# Patient Record
Sex: Female | Born: 1995 | Race: White | Hispanic: No | Marital: Single | Smoking: Never smoker
Health system: Southern US, Community
[De-identification: ages and names within clinical notes are randomized; demographics above are authoritative.]

## PROBLEM LIST (undated history)

## (undated) DIAGNOSIS — R569 Unspecified convulsions: Secondary | ICD-10-CM

## (undated) DIAGNOSIS — H547 Unspecified visual loss: Secondary | ICD-10-CM

## (undated) DIAGNOSIS — G809 Cerebral palsy, unspecified: Secondary | ICD-10-CM

## (undated) DIAGNOSIS — Z7689 Persons encountering health services in other specified circumstances: Secondary | ICD-10-CM

## (undated) HISTORY — DX: Unspecified visual loss: H54.7

## (undated) HISTORY — PX: OTHER SURGICAL HISTORY: SHX169

## (undated) HISTORY — DX: Persons encountering health services in other specified circumstances: Z76.89

## (undated) HISTORY — DX: Cerebral palsy, unspecified: G80.9

---
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
## 2015-01-01 NOTE — Telephone Encounter (Signed)
The Rx is in the filing cabinet up front.

## 2015-08-17 ENCOUNTER — Encounter: Payer: Self-pay | Admitting: Pediatrics

## 2015-08-17 ENCOUNTER — Ambulatory Visit (INDEPENDENT_AMBULATORY_CARE_PROVIDER_SITE_OTHER): Payer: Medicaid Other | Admitting: Pediatrics

## 2015-08-17 VITALS — BP 110/78 | Temp 99.5°F | Wt 104.4 lb

## 2015-08-17 DIAGNOSIS — G801 Spastic diplegic cerebral palsy: Secondary | ICD-10-CM | POA: Diagnosis not present

## 2015-08-17 DIAGNOSIS — Q829 Congenital malformation of skin, unspecified: Secondary | ICD-10-CM | POA: Diagnosis not present

## 2015-08-17 DIAGNOSIS — R6889 Other general symptoms and signs: Secondary | ICD-10-CM | POA: Diagnosis not present

## 2015-08-17 DIAGNOSIS — G808 Other cerebral palsy: Secondary | ICD-10-CM

## 2015-08-17 DIAGNOSIS — Z0001 Encounter for general adult medical examination with abnormal findings: Secondary | ICD-10-CM

## 2015-08-17 DIAGNOSIS — L858 Other specified epidermal thickening: Secondary | ICD-10-CM

## 2015-08-17 NOTE — Patient Instructions (Signed)
Julie Richmond is doing well today  should consider transitioning to adult provider by age 20

## 2015-08-17 NOTE — Progress Notes (Signed)
Routine Well-Adolescent Visit  [name]'s personal or confidential phone number: N/A  PCP: Carma Leaven, MD   History was provided by the mother.legal guardian  Julie Richmond is a 20 y.o. female who is here for well check. .   Current concerns:has spots on the back of her arms, as been there a long time,noone else has them, not bothersome.    ROS:     Constitutional  Afebrile, normal appetite, normal activity.   Opthalmologic  no irritation or drainage.   ENT  no rhinorrhea or congestion , no sore throat, no ear pain. Cardiovascular  No chest pain Respiratory  no cough , wheeze or chest pain.  Gastointestinal  no abdominal pain, nausea or vomiting, bowel movements normal.     Genitourinary  no urgency, frequency or dysuria.   Musculoskeletal  no complaints of pain, no injuries.   Dermatologic  no rashes or lesions Neurologic - no significant history of headaches, no weakness  family history includes COPD in her paternal grandmother; Cancer in her paternal grandmother; Diabetes in her maternal grandfather.   Adolescent Assessment:  Confidentiality was discussed with the patient and if applicable, with caregiver as well.  Home and Environment:  Lives with: lives at home with mother  Sports/Exercise: in wheelchair  Smoking: no Secondhand smoke exposure? yes -  Drugs/EtOH: no   Sexuality:  -Menarche: age - females:  last menses: has menstrual suppression  - Sexually active? no  - sexual partners in last year:  - contraception use:  - Last STI Screening: none  - Violence/Abuse:   Mood: Suicidality and Depression:  Weapons:      Physical Exam:  BP 110/78 mmHg  Temp(Src) 99.5 F (37.5 C) (Temporal)  Wt 104 lb 6.4 oz (47.356 kg)  Weight: 7%ile (Z=-1.45) based on CDC 2-20 Years weight-for-age data using vitals from 08/17/2015. Normalized weight-for-stature data available only for age 50 to 5 years.  Height: No height on file for this encounter.  No  height on file for this encounter.    Objective:         General alert in NAD  Derm   keratosis pilaris on posterior upper arms  Head Normocephalic, atraumatic                    Eyes Normal, no discharge  Ears:   TMs normal bilaterally  Nose:   patent normal mucosa, turbinates normal, no rhinorhea  Oral cavity  moist mucous membranes, no lesions  Throat:   normal tonsils, without exudate or erythema  Neck supple FROM  Lymph:   . no significant cervical adenopathy  Lungs:  clear with equal breath sounds bilaterally  Breast   Heart:   regular rate and rhythm, no murmur  Abdomen:  soft nontender no organomegaly or masses  GU:  normal female  back No deformity no scoliosis  Extremities:   marked lower extremity spasticity with moderate flexion contractures at hips and knees  Neuro: Diplegic CP          Assessment/Plan:  1. Encounter for general adult medical examination with abnormal findings   2. 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 Mother has obtained permanent guardianship papers   3. Keratosis pilaris Discussed genetic nature. Can try noxema for control .  BMI: is appropriate for age  Counseling completed for all of the following vaccine components No orders of the defined types were placed in this encounter.    No Follow-up on file.  Marland Kitchen  Carma LeavenMary Jo Lainy Wrobleski, MD

## 2015-08-25 ENCOUNTER — Telehealth: Payer: Self-pay

## 2015-08-25 NOTE — Telephone Encounter (Signed)
Pt mother called and explained that the pt was seen recently. At the end of the appt. Mother requested that a letter of dx. Letter was given to mom but the insurance company is requesting more detail of dx.

## 2015-08-25 NOTE — Telephone Encounter (Signed)
What more do they need- history? Limits? prognosis?

## 2015-08-26 ENCOUNTER — Encounter: Payer: Self-pay | Admitting: Pediatrics

## 2015-08-26 NOTE — Telephone Encounter (Signed)
Mom said they need everything. For example, being legally blind etc. Any information you have on pt dx.

## 2015-08-26 NOTE — Telephone Encounter (Signed)
Letter done

## 2015-09-23 ENCOUNTER — Encounter: Payer: Self-pay | Admitting: Pediatrics

## 2015-10-02 ENCOUNTER — Other Ambulatory Visit: Payer: Self-pay | Admitting: Adult Health

## 2015-10-04 ENCOUNTER — Telehealth: Payer: Self-pay | Admitting: Adult Health

## 2015-10-04 MED ORDER — MEGESTROL ACETATE 40 MG PO TABS: 40.0000 mg | ORAL_TABLET | Freq: Every day | ORAL | Status: DC

## 2015-10-04 NOTE — Telephone Encounter (Signed)
Spoke with pt's mom. Pt has an appt scheduled Friday. She is requesting you order enough Megace to last until she is seen. Please advise. Thanks!! JSY

## 2015-10-04 NOTE — Telephone Encounter (Signed)
Left message x 1. JSY 

## 2015-10-04 NOTE — Telephone Encounter (Signed)
Will refill megace x 1

## 2015-10-08 ENCOUNTER — Encounter: Payer: Self-pay | Admitting: Adult Health

## 2015-10-08 ENCOUNTER — Ambulatory Visit (INDEPENDENT_AMBULATORY_CARE_PROVIDER_SITE_OTHER): Payer: Medicaid Other | Admitting: Adult Health

## 2015-10-08 VITALS — BP 118/80 | HR 84

## 2015-10-08 DIAGNOSIS — Z7689 Persons encountering health services in other specified circumstances: Secondary | ICD-10-CM

## 2015-10-08 DIAGNOSIS — Z124 Encounter for screening for malignant neoplasm of cervix: Secondary | ICD-10-CM | POA: Insufficient documentation

## 2015-10-08 DIAGNOSIS — Z308 Encounter for other contraceptive management: Secondary | ICD-10-CM | POA: Diagnosis not present

## 2015-10-08 HISTORY — DX: Persons encountering health services in other specified circumstances: Z76.89

## 2015-10-08 MED ORDER — MEGESTROL ACETATE 40 MG PO TABS: 40.0000 mg | ORAL_TABLET | Freq: Every day | ORAL | Status: DC

## 2015-10-08 NOTE — Patient Instructions (Signed)
Follow up in 1 year Continue megace 1 daily

## 2015-10-08 NOTE — Progress Notes (Signed)
Subjective:     Patient ID: Murriel HopperCassidy R Arp, female   DOB: 08/16/1995, 20 y.o.   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 PCP will be Dr Dimas AguasHoward in [place], [facility]   Review of Systems Patient denies any headaches, hearing loss, fatigue, blurred vision, shortness of breath, chest pain, abdominal pain, problems with bowel movements, urination, or intercourse(not having sex). No joint pain or mood swings.Will have occasional BTB on megace, likes not having a period.    Objective:   Physical Exam BP 118/80 mmHg  Pulse 84  LMP  Skin warm and dry. Neck: mid line trachea, normal thyroid, good ROM, no lymphadenopathy noted. Lungs: clear to ausculation bilaterally. Cardiovascular: regular rate and rhythm.    Assessment:     Period management    Plan:     Refilled megace 40 mg #90 take 1 daily with 3 refills Follow up in 1 year

## 2016-05-18 ENCOUNTER — Encounter: Payer: Self-pay | Admitting: Pediatrics

## 2016-05-18 ENCOUNTER — Ambulatory Visit (INDEPENDENT_AMBULATORY_CARE_PROVIDER_SITE_OTHER): Payer: Medicaid Other | Admitting: Pediatrics

## 2016-05-18 VITALS — BP 112/76 | Temp 100.0°F | Wt 107.4 lb

## 2016-05-18 DIAGNOSIS — G808 Other cerebral palsy: Secondary | ICD-10-CM

## 2016-05-18 DIAGNOSIS — G801 Spastic diplegic cerebral palsy: Secondary | ICD-10-CM

## 2016-05-18 DIAGNOSIS — Z0000 Encounter for general adult medical examination without abnormal findings: Secondary | ICD-10-CM | POA: Diagnosis not present

## 2016-05-18 DIAGNOSIS — Z23 Encounter for immunization: Secondary | ICD-10-CM | POA: Diagnosis not present

## 2016-05-18 NOTE — Progress Notes (Signed)
Routine Well-Adult  PCP: Carma LeavenMary Jo McDonell, MD   History was provided by the patient and mother.  Julie Richmond is a 21 y.o. female who is here for yearly well adult.   Current concerns: none, doing well. Currently taking baclofen and she states that she has not had any side effects with this like she did with her other medication.  She is also still taking Megace as prescribed by Gynecology, and she will occasionally have spotting, but, nothing that is bothersome. She plans to see Gyn again in the summer.    Adolescent Assessment:  Confidentiality was discussed with the patient and if applicable, with caregiver as well.  Home and Environment:  Lives with: lives at home with parents, sister Parental relations: good  Friends/Peers: good  Nutrition/Eating Behaviors: tries to eat healthy  Sports/Exercise:  No, mother is trying to encourage patient to stretch and exercise daily   Education and Employment:  School Status: not in school School History: n/a Work: no Activities: likes to [employer] and make bead bracelets   With parent out of the room and confidentiality discussed:   Patient reports being comfortable and safe at school and at home? Yes  Smoking: no Secondhand smoke exposure? no Drugs/EtOH: no   Sexuality:  -Menarche: post menarchal - females:  last menses: taking Megace, does not have periods monthly, occasional spotting  - Menstrual History: see above   - Sexually active? no  - sexual partners in last year: 0 - contraception use: no method - Last STI Screening: n/a  - Violence/Abuse: no concerns   Mood: Suicidality and Depression: normal  Weapons: none    Physical Exam:  BP 112/76   Temp 100 F (37.8 C) (Temporal)   Wt 107 lb 6.4 oz (48.7 kg)  Growth percentile SmartLinks can only be used for patients less than 21 years old.  General Appearance:   alert, oriented, no acute distress  HENT: Normocephalic, conjunctiva clear  Mouth:   Normal appearing  teeth, no obvious discoloration, dental caries, or dental caps  Neck:   Supple; thyroid: no enlargement, symmetric, no tenderness/mass/nodules  Lungs:   Clear to auscultation bilaterally, normal work of breathing  Heart:   Regular rate and rhythm, S1 and S2 normal, no murmurs;   Abdomen:   Soft, non-tender, no mass, or organomegaly  GU genitalia not examined today   Skin/Hair/Nails:   Skin warm, dry and intact, no rashes, no bruises or petechiae  Neurologic:   Patient in wheelchair Right lower extremity stronger than left; approx 3/4 upper extremity and lower extremity strength     Assessment/Plan:  BMI: is appropriate for age  Immunizations today: per orders. History of previous adverse reactions to immunizations? no Counseling completed for all of the vaccine components. Orders Placed This Encounter  Procedures  . HPV 9-valent vaccine,Recombinat  . Hepatitis A vaccine pediatric / adolescent 2 dose IM   RTC in 6 months for HPV #3, nurse visit   - Follow-up visit as needed   Transition to adult care by the age of 21   Julie Ozharlene M Yani Lal, MD

## 2016-05-18 NOTE — Patient Instructions (Signed)
Preventive Care for [place], Female The transition to life after high school as a young adult can be a stressful time with many changes. You may start seeing a primary care physician instead of a pediatrician. This is the time when your health care becomes your responsibility. Preventive care refers to lifestyle choices and visits with your health care provider that can promote health and wellness. What does preventive care include?  A yearly physical exam. This is also called an annual wellness visit.  Dental exams once or twice a year.  Routine eye exams. Ask your health care provider how often you should have your eyes checked.  Personal lifestyle choices, including:  Daily care of your teeth and gums.  Regular physical activity.  Eating a healthy diet.  Avoiding tobacco and drug use.  Avoiding or limiting alcohol use.  Practicing safe sex.  Taking vitamin and mineral supplements as recommended by your health care provider. What happens during an annual wellness visit? Preventive care starts with a yearly visit to your primary care physician. The services and screenings done by your health care provider during your annual wellness visit will depend on your overall health, lifestyle risk factors, and family history of disease. Counseling  Your health care provider may ask you questions about:  Past medical problems and your family's medical history.  Medicines or supplements you take.  Health insurance and access to health care.  Alcohol, tobacco, and drug use.  Your safety at home, work, or school.  Access to firearms.  Emotional well-being and how you cope with stress.  Relationship well-being.  Diet, exercise, and sleep habits.  Your sexual health and activity.  Your methods of birth control.  Your menstrual cycle.  Your pregnancy history. Screening  You may have the following tests or measurements:  Height, weight, and BMI.  Blood  pressure.  Lipid and cholesterol levels.  Tuberculosis skin test.  Skin exam.  Vision and hearing tests.  Screening test for hepatitis.  Screening tests for sexually transmitted diseases (STDs), if you are at risk.  BRCA-related cancer screening. This may be done if you have a family history of breast, ovarian, tubal, or peritoneal cancers.  Pelvic exam and Pap test. This may be done every 3 years starting at age 14. Vaccines  Your health care provider may recommend certain vaccines, such as:  Influenza vaccine. This is recommended every year.  Tetanus, diphtheria, and acellular pertussis (Tdap, Td) vaccine. You may need a Td booster every 10 years.  Varicella vaccine. You may need this if you have not been vaccinated.  HPV vaccine. If you are 61 or younger, you may need three doses over 6 months.  Measles, mumps, and rubella (MMR) vaccine. You may need at least one dose of MMR. You may also need a second dose.  Pneumococcal 13-valent conjugate (PCV13) vaccine. You may need this if you have certain conditions and were not previously vaccinated.  Pneumococcal polysaccharide (PPSV23) vaccine. You may need one or two doses if you smoke cigarettes or if you have certain conditions.  Meningococcal vaccine. One dose is recommended if you are age 109-21 years and a first-year college student living in a residence hall, or if you have one of several medical conditions. You may also need additional booster doses.  Hepatitis A vaccine. You may need this if you have certain conditions or if you travel or work in places where you may be exposed to hepatitis A.  Hepatitis B vaccine. You may need this  if you have certain conditions or if you travel or work in places where you may be exposed to hepatitis B.  Haemophilus influenzae type b (Hib) vaccine. You may need this if you have certain risk factors. Talk to your health care provider about which screenings and vaccines you need and how  often you need them. What steps can I take to develop healthy behaviors?  Have regular preventive health care visits with your primary care physician and dentist.  Eat a healthy diet.  Drink enough fluid to keep your urine clear or pale yellow.  Stay active. Exercise at least 30 minutes 5 or more days of the week.  Use alcohol responsibly.  Maintain a healthy weight.  Do not use any products that contain nicotine, such as cigarettes, chewing tobacco, and e-cigarettes. If you need help quitting, ask your health care provider.  Do not use drugs.  Practice safe sex.  Use birth control (contraception) to prevent unwanted pregnancy. If you plan to become pregnant, see your health care provider for a pre-conception visit.  Find healthy ways to manage stress. How can I protect myself from injury? Injuries from violence or accidents are the leading cause of death among young adults and can often be prevented. Take these steps to help protect yourself:  Always wear your seat belt while driving or riding in a vehicle.  Do not drive if you have been drinking alcohol. Do not ride with someone who has been drinking.  Do not drive when you are tired or distracted. Do not text while driving.  Wear a helmet and other protective equipment during sports activities.  If you have firearms in your house, make sure you follow all gun safety procedures.  Seek help if you have been bullied, physically abused, or sexually abused.  Use the Internet responsibly to avoid dangers such as online bullying and online sexual predators. What can I do to cope with stress? Young adults may face many new challenges that can be stressful, such as finding a job, going to college, moving away from home, managing money, being in a relationship, getting married, and having children. To manage stress:  Avoid known stressful situations when you can.  Exercise regularly.  Find a stress-reducing activity that works  best for you. Examples include meditation, yoga, listening to music, or reading.  Spend time in nature.  Keep a journal to write about your stress and how you respond.  Talk to your health care provider about stress. He or she may suggest counseling.  Spend time with supportive friends or family.  Do not cope with stress by:  Drinking alcohol or using drugs.  Smoking cigarettes.  Eating. Where can I get more information? Learn more about preventive care and healthy habits from:  [place] and Gynecologists: KaraokeExchange.nl  U.S. Probation officer Task Force: StageSync.si  National Adolescent and [place]: StrategicRoad.nl  American Academy of Pediatrics Bright Futures: https://brightfutures.MemberVerification.co.za  Society for Adolescent Health and Medicine: MoralBlog.co.za.aspx  PodExchange.nl: ToyLending.fr This information is not intended to replace advice given to you by your health care provider. Make sure you discuss any questions you have with your health care provider. Document Released: 07/29/2015 Document Revised: 08/19/2015 Document Reviewed: 07/29/2015 Elsevier Interactive Patient Education  2017 [employer].

## 2016-06-30 ENCOUNTER — Encounter: Payer: Self-pay | Admitting: Pediatrics

## 2016-06-30 ENCOUNTER — Telehealth: Payer: Self-pay | Admitting: Pediatrics

## 2016-06-30 NOTE — Telephone Encounter (Signed)
Called mom and she stated will pick up today °

## 2016-06-30 NOTE — Telephone Encounter (Signed)
pts mom is calling asking if we can type up document regarding the condition of pt for her to be excused from jury duty. Mom states shes a single parent and cant leave her with anyone else. [address] is [address] [place] [address]

## 2016-06-30 NOTE — Telephone Encounter (Signed)
Called mom and she stated will pick up today

## 2016-06-30 NOTE — Telephone Encounter (Signed)
Letter is done.

## 2016-09-15 ENCOUNTER — Ambulatory Visit: Payer: Medicaid Other

## 2016-10-09 ENCOUNTER — Ambulatory Visit (INDEPENDENT_AMBULATORY_CARE_PROVIDER_SITE_OTHER): Payer: Medicaid Other | Admitting: Adult Health

## 2016-10-09 ENCOUNTER — Encounter: Payer: Self-pay | Admitting: Adult Health

## 2016-10-09 VITALS — BP 110/70 | HR 80 | Ht 63.0 in | Wt 101.0 lb

## 2016-10-09 DIAGNOSIS — N926 Irregular menstruation, unspecified: Secondary | ICD-10-CM | POA: Diagnosis not present

## 2016-10-09 DIAGNOSIS — Z7689 Persons encountering health services in other specified circumstances: Secondary | ICD-10-CM

## 2016-10-09 MED ORDER — MEGESTROL ACETATE 40 MG PO TABS: 40.0000 mg | ORAL_TABLET | Freq: Every day | ORAL | 3 refills | Status: DC

## 2016-10-09 NOTE — Progress Notes (Signed)
Subjective:     Patient ID: Julie Richmond, female   DOB: 08/14/1995, 21 y.o.   MRN: [id number]  HPI Julie Richmond is a 21 year old white female, single, G0P0, in wheelchair has CP and is blind, in to get megace refilled for period management, and periods are good, no BTB.She is not sexually active, and needs first pap, was sugggested by orthopedic doctor to get one.  Review of Systems Periods good, no BTB  Not sexually active Reviewed past medical,surgical, social and family history. Reviewed medications and allergies.     Objective:   Physical Exam BP 110/70 (BP Location: Left Arm, Patient Position: Sitting, Cuff Size: Small)   Pulse 80   Ht 5\' 3"  (1.6 m)   Wt 101 lb (45.8 kg)   BMI 17.89 kg/m   Skin warm and dry. Neck: mid line trachea, normal thyroid, good ROM, no lymphadenopathy noted. Lungs: clear to ausculation bilaterally. Cardiovascular: regular rate and rhythm.   PHQ 2 score 0.   Assessment:     1. Encounter for menstrual regulation       Plan:     Refilled megace 40 mg #90 take 1 daily with 3 refills Return in 4 weeks for pap and physical with me

## 2016-11-06 ENCOUNTER — Encounter: Payer: Self-pay | Admitting: Adult Health

## 2016-11-06 ENCOUNTER — Ambulatory Visit (INDEPENDENT_AMBULATORY_CARE_PROVIDER_SITE_OTHER): Payer: Medicaid Other | Admitting: Adult Health

## 2016-11-06 VITALS — BP 148/80 | HR 76

## 2016-11-06 DIAGNOSIS — Z01419 Encounter for gynecological examination (general) (routine) without abnormal findings: Secondary | ICD-10-CM

## 2016-11-06 DIAGNOSIS — Z01411 Encounter for gynecological examination (general) (routine) with abnormal findings: Secondary | ICD-10-CM

## 2016-11-06 DIAGNOSIS — Z0001 Encounter for general adult medical examination with abnormal findings: Secondary | ICD-10-CM | POA: Diagnosis not present

## 2016-11-06 DIAGNOSIS — R319 Hematuria, unspecified: Secondary | ICD-10-CM | POA: Diagnosis not present

## 2016-11-06 DIAGNOSIS — Z124 Encounter for screening for malignant neoplasm of cervix: Secondary | ICD-10-CM

## 2016-11-06 DIAGNOSIS — Z7689 Persons encountering health services in other specified circumstances: Secondary | ICD-10-CM

## 2016-11-06 LAB — POCT URINALYSIS DIPSTICK
GLUCOSE UA: NEGATIVE
KETONES UA: NEGATIVE
LEUKOCYTES UA: NEGATIVE
Nitrite, UA: NEGATIVE
PROTEIN UA: NEGATIVE

## 2016-11-06 NOTE — Patient Instructions (Signed)
Physical in 1 year  Continue megace

## 2016-11-06 NOTE — Progress Notes (Signed)
Patient ID: Julie Richmond, female   DOB: 02/15/1996, 21 y.o.   MRN: [id number] History of Present Illness: Julie PickleCassidy is a 21 year old white female, single, G0P0, with CP in for well woman gyn exam, and first pap. She is nervous.Mom with pt. PCP is Dr Meredeth IdeFleming.   Current Medications, Allergies, Past Medical History, Past Surgical History, Family History and Social History were reviewed in [facility] electronic medical record.     Review of Systems: Patient denies any headaches, hearing loss, fatigue, shortness of breath, chest pain, abdominal pain, problems with bowel movements, urination, or intercourse(never had sex). No joint pain or mood swings. She is on megace to stop periods and will have rare BTB, but no period, which she and mom are thankful for.    Physical Exam:BP (!) 148/80 (BP Location: Left Arm, Patient Position: Sitting, Cuff Size: Normal)   Pulse 76 urine trace blood General:  Well developed, well nourished, no acute distress Skin:  Warm and dry Neck:  Midline trachea, normal thyroid, good ROM, no lymphadenopathy Lungs; Clear to auscultation bilaterally Breast:  No dominant palpable mass, retraction, or nipple discharge Cardiovascular: Regular rate and rhythm Abdomen:  Soft, non tender, no hepatosplenomegaly Pelvic:  External genitalia is normal in appearance, no lesions.  The vagina is not well visualized, legs stiff. Urethra not visulaized. The cervix is not visualized, pap performed by letting pt insert pap brush and do blind sweep.  Uterus is felt to be normal size, shape, and contour.  No adnexal masses or tenderness noted.Bladder is non tender, no masses felt, with palpation on top only, did not perform bimanuel.  Extremities/musculoskeletal:  No swelling or varicosities noted, no cyanosis,feet stiff Psych:  No mood changes, alert and cooperative,seems happy PHQ 2 score 0.   Impression:  1. Well woman exam with routine gynecological exam   2. Routine cervical  smear   3. Encounter for menstrual regulation   4. Hematuria, unspecified type   5.      Has CP in wheelchair   Plan: UA C&S sent Physical in 1 year, pap in 3 if normal Continue megace, has refills

## 2016-11-07 LAB — URINALYSIS, ROUTINE W REFLEX MICROSCOPIC
BILIRUBIN UA: NEGATIVE
GLUCOSE, UA: NEGATIVE
KETONES UA: NEGATIVE
Leukocytes, UA: NEGATIVE
Nitrite, UA: NEGATIVE
PROTEIN UA: NEGATIVE
RBC, UA: NEGATIVE
SPEC GRAV UA: 1.018 (ref 1.005–1.030)
Urobilinogen, Ur: 0.2 mg/dL (ref 0.2–1.0)
pH, UA: 7 (ref 5.0–7.5)

## 2016-11-08 ENCOUNTER — Telehealth: Payer: Self-pay | Admitting: Adult Health

## 2016-11-08 LAB — CYTOLOGY - PAP

## 2016-11-08 LAB — URINE CULTURE

## 2016-11-08 NOTE — Telephone Encounter (Signed)
Mom aware urine showed no blood and no growth

## 2016-11-08 NOTE — Telephone Encounter (Signed)
Left message that pap was unsatisfactory, can try again in a month or 2 if desired

## 2017-03-15 ENCOUNTER — Ambulatory Visit (HOSPITAL_COMMUNITY)
Admission: RE | Admit: 2017-03-15 | Discharge: 2017-03-15 | Disposition: A | Discharge status: Home / Self Care | Payer: Medicaid Other | Source: Ambulatory Visit | Attending: Physical Medicine and Rehabilitation | Admitting: Physical Medicine and Rehabilitation

## 2017-03-15 ENCOUNTER — Other Ambulatory Visit (HOSPITAL_COMMUNITY): Payer: Self-pay | Admitting: Physical Medicine and Rehabilitation

## 2017-03-15 DIAGNOSIS — G801 Spastic diplegic cerebral palsy: Secondary | ICD-10-CM | POA: Insufficient documentation

## 2017-03-15 DIAGNOSIS — K5901 Slow transit constipation: Secondary | ICD-10-CM

## 2017-10-16 ENCOUNTER — Other Ambulatory Visit: Payer: Self-pay | Admitting: Adult Health

## 2018-01-22 ENCOUNTER — Encounter: Payer: Self-pay | Admitting: Pediatrics

## 2018-01-23 ENCOUNTER — Encounter: Payer: Self-pay | Admitting: Pediatrics

## 2018-09-16 ENCOUNTER — Other Ambulatory Visit (HOSPITAL_COMMUNITY): Payer: Self-pay | Admitting: Specialist

## 2018-09-16 DIAGNOSIS — R1319 Other dysphagia: Secondary | ICD-10-CM

## 2018-09-17 ENCOUNTER — Ambulatory Visit (HOSPITAL_COMMUNITY): Payer: Medicaid Other | Admitting: Speech Pathology

## 2018-09-17 ENCOUNTER — Encounter (HOSPITAL_COMMUNITY): Payer: Self-pay

## 2018-09-26 ENCOUNTER — Other Ambulatory Visit (HOSPITAL_COMMUNITY): Payer: Self-pay | Admitting: Physical Medicine and Rehabilitation

## 2018-09-26 ENCOUNTER — Other Ambulatory Visit: Payer: Self-pay

## 2018-09-26 ENCOUNTER — Encounter

## 2018-09-26 ENCOUNTER — Ambulatory Visit (HOSPITAL_COMMUNITY)
Admission: RE | Admit: 2018-09-26 | Discharge: 2018-09-26 | Disposition: A | Discharge status: Home / Self Care | Payer: Medicaid Other | Source: Ambulatory Visit | Attending: Physical Medicine and Rehabilitation | Admitting: Physical Medicine and Rehabilitation

## 2018-09-26 ENCOUNTER — Ambulatory Visit (HOSPITAL_COMMUNITY): Payer: Medicaid Other | Attending: Physical Medicine and Rehabilitation | Admitting: Speech Pathology

## 2018-09-26 ENCOUNTER — Encounter (HOSPITAL_COMMUNITY): Payer: Self-pay | Admitting: Speech Pathology

## 2018-09-26 DIAGNOSIS — R1319 Other dysphagia: Secondary | ICD-10-CM

## 2018-09-26 DIAGNOSIS — R1312 Dysphagia, oropharyngeal phase: Secondary | ICD-10-CM | POA: Insufficient documentation

## 2018-09-26 NOTE — Therapy (Signed)
[facility] [address] [place], [place], [address] Phone: [phone]   Fax:  [phone]  Modified Barium Swallow  Patient Details  Name: Julie HopperCassidy R Axley MRN: [id number] Date of Birth: 11/11/1995 No data recorded  Encounter Date: 09/26/2018  End of Session - 09/26/18 1305    Visit Number  1    Number of Visits  1    Authorization Type  Medicaid    SLP Start Time  1115    SLP Stop Time   1200    SLP Time Calculation (min)  45 min    Activity Tolerance  Patient tolerated treatment well       Past Medical History:  Diagnosis Date  . Blind   . Cerebral palsy (HCC)   . Encounter for menstrual regulation 10/08/2015  . Menstrual extraction 09/08/2013    Past Surgical History:  Procedure Laterality Date  . hamstrings lengthened      There were no vitals filed for this visit.  Subjective Assessment - 09/26/18 1252    Subjective  "I feel sick when I eat."    Patient is accompained by:  Family member    Special Tests  MBSS    Currently in Pain?  No/denies          General - 09/26/18 1253      General Information   Date of Onset  08/26/18    HPI  Julie Richmond is a 23 yo female with spastic diplegic cerebral palsy with a history of esophageal reflux disease who was referred for MBSS by her physical medicine and rehab doctor, Dr. Arneta ClicheKimberly K Rauch. Julie Richmond was accompanied to the appointment by her mother, Julie Richmond who assisted in providing additional background information. She indicates that ~3-4 weeks ago, Julie Richmond complained of feeling nauseous during po intake and began avoiding eating certain foods. She previously took Zantac, but was recently changed to Nexium in the AM.    Type of Study  MBS-Modified Barium Swallow Study    Previous Swallow Assessment  None on record    Diet Prior to this Study  Regular;Thin liquids    Temperature Spikes Noted  No    Respiratory Status  Room air    History of Recent Intubation  No     Behavior/Cognition  Alert;Cooperative;Pleasant mood    Oral Cavity Assessment  Within Functional Limits    Oral Care Completed by SLP  No    Oral Cavity - Dentition  Adequate natural dentition    Vision  Functional for self feeding    Self-Feeding Abilities  Able to feed self    Patient Positioning  Upright in chair    Baseline Vocal Quality  Normal    Volitional Cough  Strong    Volitional Swallow  Able to elicit    Anatomy  Within functional limits    Pharyngeal Secretions  Not observed secondary MBS         Oral Preparation/Oral Phase - 09/26/18 1254      Oral Preparation/Oral Phase   Oral Phase  Impaired      Oral - Thin   Oral - Thin Teaspoon  Within functional limits    Oral - Thin Cup  Within functional limits    Oral - Thin Straw  Within functional limits      Oral - Solids   Oral - Puree  Within functional limits;Piecemeal swallowing    Oral - Regular  Within functional limits;Piecemeal swallowing  Oral - Pill  Within functional limits      Electrical stimulation - Oral Phase   Was Electrical Stimulation Used  No       Pharyngeal Phase - 09/26/18 1254      Pharyngeal Phase   Pharyngeal Phase  Impaired      Pharyngeal - Thin   Pharyngeal- Thin Teaspoon  Within functional limits    Pharyngeal- Thin Cup  Within functional limits    Pharyngeal- Thin Straw  Within functional limits      Pharyngeal - Solids   Pharyngeal- Puree  Within functional limits    Pharyngeal- Regular  Within functional limits    Pharyngeal- Pill  Within functional limits      Electrical Stimulation - Pharyngeal Phase   Was Electrical Stimulation Used  No       Cricopharyngeal Phase - 09/26/18 1256      Cervical Esophageal Phase   Cervical Esophageal Phase  Impaired      Cervical Esophageal Phase - Thin   Thin Cup  Esophageal backflow into cervical esophagus      Cervical Esophageal Phase - Solids   Pill  Other (Comment)   brief stasis of pill in esophagus, cleared  with liquid wash     Cervical Esophageal Phase - Comment   Cervical Esophageal Comment  Retrograde movement of thins noted with sequential swallows        Plan - 09/26/18 1306    Clinical Impression Statement  Pt presents with normal oropharyngeal swallow with prompt swallow trigger, adequate hyolaryngeal excursion, complete laryngeal vestibule closure, and no substantial pharyngeal residuals post swallow. Pt with piecemeal deglutition with solids. The barium tablet became transiently delayed near transaortic arch, but cleared with liquid wash. One episode of retrograde movement of thin liquids noted during sequential cup sips of liquid with liquids moving from distal esophagus to the cervical esophagus, however cleared with follow up sip of liquid. Pt denies difficulty swallowing and seems to report only that she feels nauseous with po intake. Consider GI consult if weight loss and reduced po intake is a concern. Pt recently started taking Nexium, however her mother feels that it is not helping. No further SLP services indicated at this time. The imaging from this study was reviewed with Julie Richmond and her mother. Recommend regular textures (consider mech soft from a GI standpoint and avoid reflux trigger foods) and thin liquids.    Consulted and Agree with Plan of Care  Patient;Family member/caregiver       Patient will benefit from skilled therapeutic intervention in order to improve the following deficits and impairments:   1. Dysphagia, oropharyngeal phase       Recommendations/Treatment - 09/26/18 1303      Swallow Evaluation Recommendations   Recommended Consults  Consider GI evaluation    SLP Diet Recommendations  Thin;Age appropriate regular    Liquid Administration via  Cup;Straw    Medication Administration  Whole meds with liquid    Supervision  Patient able to self feed    Compensations  Follow solids with liquid    Postural Changes  Seated upright at 90 degrees;Remain upright  for at least 30 minutes after feeds/meals       Prognosis - 09/26/18 1305      Prognosis   Prognosis for Safe Diet Advancement  Good      Individuals Consulted   Report Sent to   Referring physician       Problem List Patient Active Problem List  Diagnosis Date Noted  . Hematuria 11/06/2016  . Routine cervical smear 10/08/2015  . Menstrual extraction 09/08/2013  . Congenital diplegia ([place]) 09/19/2012  . Difficulty in walking(719.7) 09/19/2012   Thank you,  Genene Churn, Reardan  Iyona Pehrson 09/26/2018, 1:07 PM  [place], [place], [address] Phone: [phone]   Fax:  [phone]  Name: AINE STRYCHARZ MRN: [id number] Date of Birth: 19-Mar-1996

## 2018-09-30 ENCOUNTER — Other Ambulatory Visit: Payer: Self-pay | Admitting: Adult Health

## 2018-10-25 ENCOUNTER — Encounter: Payer: Self-pay | Admitting: Internal Medicine

## 2018-11-01 ENCOUNTER — Other Ambulatory Visit: Payer: Self-pay | Admitting: *Deleted

## 2018-11-01 ENCOUNTER — Encounter: Payer: Self-pay | Admitting: Gastroenterology

## 2018-11-01 ENCOUNTER — Encounter: Payer: Self-pay | Admitting: *Deleted

## 2018-11-01 ENCOUNTER — Telehealth: Payer: Self-pay | Admitting: Gastroenterology

## 2018-11-01 ENCOUNTER — Ambulatory Visit (INDEPENDENT_AMBULATORY_CARE_PROVIDER_SITE_OTHER): Payer: Medicaid Other | Admitting: Gastroenterology

## 2018-11-01 ENCOUNTER — Other Ambulatory Visit: Payer: Self-pay

## 2018-11-01 VITALS — BP 133/87 | HR 123 | Temp 98.1°F | Ht 64.0 in | Wt 117.8 lb

## 2018-11-01 DIAGNOSIS — K219 Gastro-esophageal reflux disease without esophagitis: Secondary | ICD-10-CM

## 2018-11-01 DIAGNOSIS — R634 Abnormal weight loss: Secondary | ICD-10-CM

## 2018-11-01 DIAGNOSIS — R110 Nausea: Secondary | ICD-10-CM

## 2018-11-01 DIAGNOSIS — R195 Other fecal abnormalities: Secondary | ICD-10-CM

## 2018-11-01 DIAGNOSIS — R1310 Dysphagia, unspecified: Secondary | ICD-10-CM

## 2018-11-01 MED ORDER — ONDANSETRON HCL 4 MG PO TABS: 4.0000 mg | ORAL_TABLET | Freq: Three times a day (TID) | ORAL | 0 refills | Status: DC | PRN

## 2018-11-01 MED ORDER — PANTOPRAZOLE SODIUM 40 MG PO TBEC: 40.0000 mg | DELAYED_RELEASE_TABLET | Freq: Every day | ORAL | 3 refills | Status: DC

## 2018-11-01 NOTE — Patient Instructions (Signed)
Please stop Nexium. Start pantoprazole 40mg  daily before breakfast.   Use Zofran one tablet every 8 hours as needed for nausea.   Please complete labs.   Upper endoscopy as scheduled. See separate instructions.   Call with any new or worsening symptoms.

## 2018-11-01 NOTE — Addendum Note (Signed)
Addended by: Mahala Menghini on: 11/01/2018 03:09 PM   Modules accepted: Orders

## 2018-11-01 NOTE — Telephone Encounter (Signed)
Patient's EGD/ED with RMR needs to be with propofol.  Inadvertently left that off the orders.She is on schedule for 8/12 for conscious sedation.   Spoke with Julie Richmond, potentially could add her to 8/13. Currently covid testing scheduled for 8/10 at 9am.  I have spoke to Dr. Gala Romney.  Appears there may be an opening on August 13 at 315.  I have left a detailed message to speak to patient's mother, Julie Richmond.  Provided my callback number to her.  Informed that we may need to move her procedure date and advised her to hold off on going for COVID testing until she hears from the office first thing Monday morning.

## 2018-11-01 NOTE — Telephone Encounter (Signed)
Spoke to [employer]. She spoke with spouse. They do not want Aranda to have to have COVID test and prefer to consider barium testing instead.   Please cancel EGD. Please cancel COVID test.   Schedule Barium pill esophagram and UGI series for Dx: dysphagia, nausea with meals.   Add on H.pylori serologies to her labs drawn today. I added orders.

## 2018-11-01 NOTE — Progress Notes (Signed)
Primary Care Physician:  Merrily Brittleauch, Kimberly Karrat, DO  Primary Gastroenterologist:  Roetta SessionsMichael Rourk, MD   Chief Complaint  Patient presents with  . Diarrhea    mouth ulcers,sweating during meals, nausea after eating,trouble swallowing gummies    HPI:  Julie Richmond is a 23 y.o. female with spastic diplegic cerebral palsy, GERD, postprandial nausea here at the request of Dr.Kimberly Cleda Clarksauch, [facility] for consideration of EGD with H. pylori testing.  Currently she is in between PCPs, transitioning from pediatric to adult medicine.  She presents with her mother today who helps provide history.    According to patient's mother, Gus Heightmy Tabron, patient has had similar episodes in the past and was found to be reflux related.  She had been doing well up until Zantac was taken off the market.  Since then there have been increased feeding difficulties off of Zantac.  Had been switched to Pepcid.  Due to increased nausea with eating she then was started on omeprazole 20 mg daily.  Modified barium swallow study September 26, 2018 which showed "one episode of retrograde movement of thin liquids noted during sequential cup sips of liquid with liquids moving from the distal esophagus to the cervical esophagus, however cleared with follow up sip of liquid".  Barium tablet became transiently delayed near the transaortic arch, but cleared with liquid wash.  Recently put on Nexium 40 mg daily.  Continues to have issues.  Initially mom thought she was having swallowing issues, she would look panicked while eating.  Particularly would have problems getting vitamin Gummies to go down.  She had to completely stop those.  This is why she ended up with a swallowing study.  In the past she used to enjoy eating.  Typically would eat 3 meals a day and several snacks.  Now appetite is cut.  While eating she will develop sweating, feels nauseated but never vomits.  At times feels like the food is coming back up like  refluxing.  Denies abdominal pain, heartburn.  As far as bowel movements, she used to have 2-3 stools per week.  Now she has 1-2 loose stools per day.  No melena or rectal bleeding.  Bowel changes happened about 2 months ago.  No recent weights for comparison.  She weighed 119 pounds 1 year ago, down to 117 today.  According to mom, abdomen is much smaller and feels lighter her when she is assisting her.  Patient gets very nervous at doctor's appointments.  Patient's mom states her heart rate is always up.  No known food allergies.  No known recent tick exposures.  Current Outpatient Medications  Medication Sig Dispense Refill  . esomeprazole (NEXIUM) 40 MG capsule Take 40 mg by mouth daily at 12 noon.    Marland Kitchen. ibuprofen (ADVIL,MOTRIN) 200 MG tablet Take 600 mg by mouth as needed.    . megestrol (MEGACE) 40 MG tablet TAKE 1 TABLET BY MOUTH EVERY DAY 30 tablet 11   No current facility-administered medications for this visit.     Allergies as of 11/01/2018  . (No Known Allergies)    Past Medical History:  Diagnosis Date  . Blind   . Cerebral palsy (HCC)   . Encounter for menstrual regulation 10/08/2015  . Menstrual extraction 09/08/2013    Past Surgical History:  Procedure Laterality Date  . hamstrings lengthened      Family History  Problem Relation Age of Onset  . Diabetes Maternal Grandfather   . COPD Paternal Grandmother   .  Cancer Paternal Grandmother        intestines  . Inflammatory bowel disease Neg Hx   . Celiac disease Neg Hx   . Colon cancer Neg Hx     Social History   Socioeconomic History  . Marital status: Single    Spouse name: Not on file  . Number of children: Not on file  . Years of education: Not on file  . Highest education level: Not on file  Occupational History  . Not on file  Social Needs  . Financial resource strain: Not on file  . Food insecurity    Worry: Not on file    Inability: Not on file  . Transportation needs    Medical: Not on file     Non-medical: Not on file  Tobacco Use  . Smoking status: Passive Smoke Exposure - Never Smoker  . Smokeless tobacco: Never Used  Substance and Sexual Activity  . Alcohol use: No  . Drug use: No  . Sexual activity: Never    Birth control/protection: None  Lifestyle  . Physical activity    Days per week: Not on file    Minutes per session: Not on file  . Stress: Not on file  Relationships  . Social Musicianconnections    Talks on phone: Not on file    Gets together: Not on file    Attends religious service: Not on file    Active member of club or organization: Not on file    Attends meetings of clubs or organizations: Not on file    Relationship status: Not on file  . Intimate partner violence    Fear of current or ex partner: Not on file    Emotionally abused: Not on file    Physically abused: Not on file    Forced sexual activity: Not on file  Other Topics Concern  . Not on file  Social History Narrative   Lives with parents, younger sister       No pets       Dad smokes       No school or work       ROS:  General: Negative for anorexia,  fever, chills, fatigue, weakness.  See HPI Eyes: Negative for vision changes.  ENT: Negative for hoarseness,  nasal congestion.  See HPI CV: Negative for chest pain, angina, palpitations, dyspnea on exertion, peripheral edema.  Respiratory: Negative for dyspnea at rest, dyspnea on exertion, cough, sputum, wheezing.  GI: See history of present illness. GU:  Negative for dysuria, hematuria, urinary incontinence, urinary frequency, nocturnal urination.  MS: Negative for joint pain, low back pain.  Derm: Negative for rash or itching.  Neuro: Negative for weakness, abnormal sensation, seizure, frequent headaches, memory loss, confusion.  Weakness related to CP Psych: Negative for anxiety, depression, suicidal ideation, hallucinations.  Endo: Negative for unusual weight change.  Heme: Negative for bruising or bleeding. Allergy: Negative  for rash or hives.    Physical Examination:  BP 133/87   Pulse (!) 123   Temp 98.1 F (36.7 C)   Ht 5\' 4"  (1.626 m)   Wt 117 lb 12.8 oz (53.4 kg)   LMP  (LMP Unknown)   BMI 20.22 kg/m    General: Well-nourished, well-developed in no acute distress.  Pleasant.  Able to ambulate with assistance onto the table. Head: Normocephalic, atraumatic.   Eyes: Conjunctiva pink, no icterus. Mouth: Oropharyngeal mucosa moist and pink , no lesions erythema or exudate. Neck: Supple without thyromegaly, masses,  or lymphadenopathy.  Lungs: Clear to auscultation bilaterally.  Heart: Regular rate and rhythm, no murmurs rubs or gallops.  Abdomen: Bowel sounds are normal, nontender, nondistended, no hepatosplenomegaly or masses, no abdominal bruits or    hernia , no rebound or guarding.   Rectal: Not performed Extremities: No lower extremity edema. No clubbing or deformities.  Neuro: Alert and oriented x 4 , grossly normal neurologically.  Skin: Warm and dry, no rash or jaundice.   Psych: Alert and cooperative, normal mood and affect.    Imaging Studies: No results found.  Impression/plan:  Very pleasant 23 year old female with history of spastic diplegic cerebral palsy who presents with mother, Amy, for further evaluation of postprandial nausea.  Similar symptoms in the past, felt to be related to reflux.  Did well on Zantac until was taken off the market.  She has since been on Pepcid, omeprazole, Nexium without any notable improvement.  Denies typical heartburn.  Possible regurgitation at times.  Postprandial nausea with diaphoresis but no vomiting noted frequently.  Weight down a few pounds.  Increasing difficulty eating.  Denies abdominal pain.  Modified barium swallow study with transient delay of barium tablet near transaortic arch but cleared with further swallows.  Patient has difficulty swallowing fiber Gummies.  At this time would offer upper endoscopy.  Evaluate for possibility of  eosinophilic esophagitis, gastritis, peptic ulcer disease, H. pylori.  Plan for sedation with anesthesiology.  We will switch her Nexium to pantoprazole 40 mg daily.  Obtain routine labs including screening for celiac disease and thyroid disease.  Further recommendations to follow.

## 2018-11-04 ENCOUNTER — Other Ambulatory Visit (HOSPITAL_COMMUNITY)
Admission: RE | Admit: 2018-11-04 | Discharge: 2018-11-04 | Disposition: A | Discharge status: Home / Self Care | Payer: Medicaid Other | Source: Ambulatory Visit | Attending: Internal Medicine | Admitting: Internal Medicine

## 2018-11-04 ENCOUNTER — Other Ambulatory Visit: Payer: Self-pay

## 2018-11-04 ENCOUNTER — Telehealth: Payer: Self-pay | Admitting: *Deleted

## 2018-11-04 DIAGNOSIS — R1310 Dysphagia, unspecified: Secondary | ICD-10-CM

## 2018-11-04 DIAGNOSIS — R110 Nausea: Secondary | ICD-10-CM

## 2018-11-04 NOTE — Telephone Encounter (Signed)
Barium pill esophagram and UGI series scheduled for 11/08/18 at 9:30am, arrive at 9:15am. NPO after midnight prior to test. Called and informed her mother of appt.

## 2018-11-04 NOTE — Telephone Encounter (Signed)
Julie Richmond, can you take care of letting lab know about add on h.pylori since [facility] out and labs were drawn Friday.

## 2018-11-04 NOTE — Telephone Encounter (Signed)
Wants lab results.  [phone]

## 2018-11-04 NOTE — Addendum Note (Signed)
Addended by: Mahala Menghini on: 11/04/2018 05:23 PM   Modules accepted: Orders

## 2018-11-04 NOTE — Telephone Encounter (Signed)
LMOVM for endo scheduler to cancel EGD. 

## 2018-11-04 NOTE — Addendum Note (Signed)
Addended by: Mahala Menghini on: 11/04/2018 11:49 AM   Modules accepted: Orders

## 2018-11-04 NOTE — Progress Notes (Signed)
CC'D TO PCP °

## 2018-11-04 NOTE — Progress Notes (Unsigned)
dg 

## 2018-11-05 ENCOUNTER — Telehealth: Payer: Self-pay | Admitting: Internal Medicine

## 2018-11-05 LAB — TSH+FREE T4
Free T4: 1.54 ng/dL (ref 0.82–1.77)
TSH: 1.93 u[IU]/mL (ref 0.450–4.500)

## 2018-11-05 LAB — CBC WITH DIFFERENTIAL/PLATELET
Basophils Absolute: 0 10*3/uL (ref 0.0–0.2)
Basos: 0 %
EOS (ABSOLUTE): 0 10*3/uL (ref 0.0–0.4)
Eos: 0 %
Hematocrit: 42.5 % (ref 34.0–46.6)
Hemoglobin: 14.8 g/dL (ref 11.1–15.9)
Immature Grans (Abs): 0 10*3/uL (ref 0.0–0.1)
Immature Granulocytes: 0 %
Lymphocytes Absolute: 1.9 10*3/uL (ref 0.7–3.1)
Lymphs: 22 %
MCH: 29.5 pg (ref 26.6–33.0)
MCHC: 34.8 g/dL (ref 31.5–35.7)
MCV: 85 fL (ref 79–97)
Monocytes Absolute: 0.7 10*3/uL (ref 0.1–0.9)
Monocytes: 8 %
Neutrophils Absolute: 6.2 10*3/uL (ref 1.4–7.0)
Neutrophils: 70 %
Platelets: 277 10*3/uL (ref 150–450)
RBC: 5.02 x10E6/uL (ref 3.77–5.28)
RDW: 12.4 % (ref 11.7–15.4)
WBC: 8.9 10*3/uL (ref 3.4–10.8)

## 2018-11-05 LAB — COMPREHENSIVE METABOLIC PANEL
ALT: 14 IU/L (ref 0–32)
AST: 18 IU/L (ref 0–40)
Albumin/Globulin Ratio: 2 (ref 1.2–2.2)
Albumin: 4.9 g/dL (ref 3.9–5.0)
Alkaline Phosphatase: 52 IU/L (ref 39–117)
BUN/Creatinine Ratio: 17 (ref 9–23)
BUN: 10 mg/dL (ref 6–20)
Bilirubin Total: 0.5 mg/dL (ref 0.0–1.2)
CO2: 17 mmol/L — ABNORMAL LOW (ref 20–29)
Calcium: 9.7 mg/dL (ref 8.7–10.2)
Chloride: 104 mmol/L (ref 96–106)
Creatinine, Ser: 0.58 mg/dL (ref 0.57–1.00)
GFR calc Af Amer: 150 mL/min/{1.73_m2} (ref >59)
GFR calc non Af Amer: 130 mL/min/{1.73_m2} (ref >59)
Globulin, Total: 2.5 g/dL (ref 1.5–4.5)
Glucose: 86 mg/dL (ref 65–99)
Potassium: 4 mmol/L (ref 3.5–5.2)
Sodium: 138 mmol/L (ref 134–144)
Total Protein: 7.4 g/dL (ref 6.0–8.5)

## 2018-11-05 LAB — IGA: IgA/Immunoglobulin A, Serum: 197 mg/dL (ref 87–352)

## 2018-11-05 LAB — LIPASE: Lipase: 31 U/L (ref 14–72)

## 2018-11-05 LAB — TISSUE TRANSGLUTAMINASE, IGA: Transglutaminase IgA: <2 U/mL (ref 0–3)

## 2018-11-05 NOTE — Telephone Encounter (Signed)
See prior note. Procedure was cancelled. The covid appt for lab was not cancelled.

## 2018-11-05 NOTE — Telephone Encounter (Signed)
I called [employer] and spoke to [employer] and added the [employer] ( code [phone]).  She said they pull specimen and let us know if they could not add it.

## 2018-11-05 NOTE — Telephone Encounter (Signed)
Melanie called to say patient was a no show for her covid test °

## 2018-11-05 NOTE — Telephone Encounter (Signed)
noted 

## 2018-11-06 ENCOUNTER — Encounter (HOSPITAL_COMMUNITY): Admission: RE | Payer: Self-pay | Source: Home / Self Care

## 2018-11-06 ENCOUNTER — Ambulatory Visit (HOSPITAL_COMMUNITY): Admission: RE | Admit: 2018-11-06 | Payer: Medicaid Other | Source: Home / Self Care | Admitting: Internal Medicine

## 2018-11-06 SURGERY — EGD (ESOPHAGOGASTRODUODENOSCOPY)
Anesthesia: Moderate Sedation

## 2018-11-07 NOTE — Telephone Encounter (Signed)
Forwarding to Leslie Lewis, PA for results.  

## 2018-11-07 NOTE — Telephone Encounter (Signed)
Noted  

## 2018-11-07 NOTE — Telephone Encounter (Signed)
Just received message today. Original phone call 8/10.   Please see lab results note.

## 2018-11-08 ENCOUNTER — Ambulatory Visit (HOSPITAL_COMMUNITY)
Admission: RE | Admit: 2018-11-08 | Discharge: 2018-11-08 | Disposition: A | Discharge status: Home / Self Care | Payer: Medicaid Other | Source: Ambulatory Visit | Attending: Gastroenterology | Admitting: Gastroenterology

## 2018-11-08 ENCOUNTER — Other Ambulatory Visit: Payer: Self-pay

## 2018-11-08 ENCOUNTER — Other Ambulatory Visit: Payer: Self-pay | Admitting: Gastroenterology

## 2018-11-08 DIAGNOSIS — R110 Nausea: Secondary | ICD-10-CM

## 2018-11-08 DIAGNOSIS — R1310 Dysphagia, unspecified: Secondary | ICD-10-CM

## 2018-11-13 ENCOUNTER — Encounter: Payer: Self-pay | Admitting: Internal Medicine

## 2018-11-13 LAB — H PYLORI, IGM, IGG, IGA AB
H pylori, IgM Abs: <9 units (ref 0.0–8.9)
H. pylori, IgA Abs: <9 units (ref 0.0–8.9)
H. pylori, IgG AbS: 0.1 Index Value (ref 0.00–0.79)

## 2018-11-13 LAB — SPECIMEN STATUS REPORT

## 2018-11-13 NOTE — Progress Notes (Signed)
SCHEDULED AND LETTER SENT  °

## 2018-12-03 ENCOUNTER — Ambulatory Visit: Payer: Medicaid Other | Admitting: Nurse Practitioner

## 2018-12-08 NOTE — Progress Notes (Signed)
Referring Provider: Merrily Brittleauch, Kimberly Karrat,* Primary Care Physician:  Selinda FlavinHoward, Kevin, MD Primary GI Physician: Dr. Jena Gaussourk  Chief Complaint  Patient presents with  . Gastroesophageal Reflux    c/o lots of nausea but no vomiting    HPI:   Julie Richmond is a 23 y.o. female with spastic diplegic cerebral palsy, GERD, postprandial nausea who is presenting for follow-up.  She was last seen in our office on 11/01/2018 for further evaluation of postprandial nausea without vomiting.  At last visit patient's mother, Julie Richmond, reported patient had similar symptoms in the past and was found to be reflux related.  She had been doing well up until Zantac was taken off the market.  Had been switched to Pepcid, then omeprazole, then Nexium 40 mg daily but continued with postprandial nausea.  It was also reported at last visit that patient's appetite was down, felt she was having reflux symptoms, a few pound weight loss, and mom initially thought patient was having swallowing issues as patient looked panicked while eating particularly with vitamin Gummies that they stopped completely.  Modified barium swallow study September 26, 2018 which showed "one episode of retrograde movement of thin liquids noted during sequential cup sips of liquid with liquids moving from the distal esophagus to the cervical esophagus, however cleared with follow up sip of liquid... and barium tablet became transiently delayed near the transaortic arch, but cleared with liquid wash."  Plans at that time were to obtain labs and pursue EGD.  Patient's mother did not want patient to undergo COVID testing so UGI series was completed rather than EGD.   Labs on 11/01/2018 with negative celiac screen, negative H. pylori, thyroid, lipase, kidney, and LFTs normal. CBC normal with no anemia. UGI series on 11/08/18 with small type I hiatal hernia.  No esophageal dysmotility, stricture, or ulceration.  The stomach appears grossly unremarkable.  Patient  was advised to increase Protonix to twice daily and follow-up in 6 weeks.  Today she continues to have nausea after all meals. Has been present for about 2 months. Doesn't matter what she eats. No vomiting. Will get sweaty with nausea. Mother reports patient looks "puney."  Not as active. She has lost about 4 lbs over the last month. Is not eating as much due to developing nausea. Patient also reports she gets full quickly. Symptoms are the worst at dinner. Mom reports patient will go lay down after dinner when symptoms come on, and she does not do this with breakfast or lunch. Thinks they have identified that hamburger will cause the most severe symptoms. Denies any abdominal pain. Her acid reflux and heartburn symptoms are well controlled on Protonix BID. No breakthrough symptoms. No dysphagia.   BMs are now solid. They were loose at last visit. BM 1-2 times a day with some associated urgency. Prior to onset of nausea about 2 months ago, BMs occurred every 2 days. No abdominal pain. No blood in the stool or black stools.   No recent antibiotics, hospitalization, sick contacts. Drinks well water.   Was instructed to stop Megace to see if this was causing nausea. Has not noticed any improvement. Ibuprofen maybe once every couple of weeks. No other NSAIDs.   Heart rate elevated today. Mom and patient state patients heart rate is always up at doctors appointments. Patient reports she get anxious when coming to the doctor.   Past Medical History:  Diagnosis Date  . Blind   . Cerebral palsy (HCC)   .  Encounter for menstrual regulation 10/08/2015  . Menstrual extraction 09/08/2013    Past Surgical History:  Procedure Laterality Date  . hamstrings lengthened      Current Outpatient Medications  Medication Sig Dispense Refill  . ibuprofen (ADVIL,MOTRIN) 200 MG tablet Take 400-600 mg by mouth every 8 (eight) hours as needed (headaches.).     Marland Kitchen ondansetron (ZOFRAN) 4 MG tablet Take 1 tablet (4 mg  total) by mouth every 8 (eight) hours as needed for nausea or vomiting. 30 tablet 1  . pantoprazole (PROTONIX) 40 MG tablet Take 1 tablet (40 mg total) by mouth 2 (two) times daily. 60 tablet 6   No current facility-administered medications for this visit.     Allergies as of 12/09/2018  . (No Known Allergies)    Family History  Problem Relation Age of Onset  . Diabetes Maternal Grandfather   . COPD Paternal Grandmother   . Cancer Paternal Grandmother        intestines. Had colostomy. Passed form cancer at age 62.   . Inflammatory bowel disease Neg Hx   . Celiac disease Neg Hx   . Colon cancer Neg Hx     Social History   Socioeconomic History  . Marital status: Single    Spouse name: Not on file  . Number of children: Not on file  . Years of education: Not on file  . Highest education level: Not on file  Occupational History  . Not on file  Social Needs  . Financial resource strain: Not on file  . Food insecurity    Worry: Not on file    Inability: Not on file  . Transportation needs    Medical: Not on file    Non-medical: Not on file  Tobacco Use  . Smoking status: Passive Smoke Exposure - Never Smoker  . Smokeless tobacco: Never Used  Substance and Sexual Activity  . Alcohol use: No  . Drug use: No  . Sexual activity: Never    Birth control/protection: None  Lifestyle  . Physical activity    Days per week: Not on file    Minutes per session: Not on file  . Stress: Not on file  Relationships  . Social Herbalist on phone: Not on file    Gets together: Not on file    Attends religious service: Not on file    Active member of club or organization: Not on file    Attends meetings of clubs or organizations: Not on file    Relationship status: Not on file  Other Topics Concern  . Not on file  Social History Narrative   Lives with parents, younger sister       No pets       Dad smokes       No school or work     Review of Systems: Gen:  Denies fever, chills, lightheadedness, dizziness, or pre-syncope.  CV: Denies chest pain, palpitations, syncope, peripheral edema Resp: Denies dyspnea at rest or cough GI: See HPI Derm: Denies rash Psych: Denies depression Heme: Denies bruising, bleeding  Physical Exam: BP 122/76   Pulse (!) 115   Temp (!) 97.1 F (36.2 C) (Oral)   Ht 5\' 4"  (1.626 m)   Wt 113 lb (51.3 kg)   BMI 19.40 kg/m  General:  Well-developed, well-nourished. Pleasant and cooperative. In a wheel chair. No distress noted. Head:  Normocephalic and atraumatic. Eyes:  Conjuctiva clear without scleral icterus. Heart:  S1, S2 present  without murmurs appreciated. Lungs:  Clear to auscultation bilaterally. No wheezes, rales, or rhonchi. No distress.  Abdomen:  +BS, soft, non-tender and non-distended. No rebound or guarding. No HSM or masses noted. Msk:  Symmetrical without gross deformities. Extremities:  Without edema. Neurologic:  Alert and  oriented x4 Psych: Normal mood and affect.

## 2018-12-09 ENCOUNTER — Encounter: Payer: Self-pay | Admitting: Gastroenterology

## 2018-12-09 ENCOUNTER — Ambulatory Visit (INDEPENDENT_AMBULATORY_CARE_PROVIDER_SITE_OTHER): Payer: Medicaid Other | Admitting: Gastroenterology

## 2018-12-09 ENCOUNTER — Other Ambulatory Visit: Payer: Self-pay

## 2018-12-09 VITALS — BP 122/76 | HR 115 | Temp 97.1°F | Ht 64.0 in | Wt 113.0 lb

## 2018-12-09 DIAGNOSIS — R110 Nausea: Secondary | ICD-10-CM

## 2018-12-09 DIAGNOSIS — K219 Gastro-esophageal reflux disease without esophagitis: Secondary | ICD-10-CM | POA: Diagnosis not present

## 2018-12-09 MED ORDER — PANTOPRAZOLE SODIUM 40 MG PO TBEC: 40.0000 mg | DELAYED_RELEASE_TABLET | Freq: Two times a day (BID) | ORAL | 6 refills | Status: DC

## 2018-12-09 MED ORDER — ONDANSETRON HCL 4 MG PO TABS: 4.0000 mg | ORAL_TABLET | Freq: Three times a day (TID) | ORAL | 1 refills | Status: DC | PRN

## 2018-12-09 NOTE — Patient Instructions (Signed)
PA for [place] abd RUQ submitted via [employer]. Case approved. PA# [id number], valid 12/09/18-06/07/19.

## 2018-12-09 NOTE — Patient Instructions (Addendum)
Continue taking Protonix 40 mg twice daily 30 minutes before breakfast and dinner.  You may try taking Zofran before meals to see if this helps with nausea.  Timeframe between dosing should still be 8 hours. Start with before dinner as this is when symptoms are the worst.   Please have right upper quadrant ultrasound and HIDA scan completed.  I have placed the orders.  You should be contacted with the date and time.  Further recommendations to follow.  See GERD handout below for dietary recommendations.  Continue to avoid fried and fatty foods.  Plan for follow-up in 4 to 6 weeks.  Aliene Altes, PA-C [facility]

## 2018-12-10 ENCOUNTER — Ambulatory Visit (HOSPITAL_COMMUNITY)
Admission: RE | Admit: 2018-12-10 | Discharge: 2018-12-10 | Disposition: A | Discharge status: Home / Self Care | Payer: Medicaid Other | Source: Ambulatory Visit | Attending: Gastroenterology | Admitting: Gastroenterology

## 2018-12-10 DIAGNOSIS — R110 Nausea: Secondary | ICD-10-CM | POA: Diagnosis not present

## 2018-12-11 NOTE — Assessment & Plan Note (Signed)
Currently well controlled on Protonix BID. Continue current medications.

## 2018-12-11 NOTE — Assessment & Plan Note (Addendum)
23 year old female with history of spastic diplegic cerebral palsy and GERD who presents with mother, Amy, for follow-up of postprandial nausea. At last visit, patients mother states patient had similar symptoms in the past that were felt to be reflux related. She has since been started on Protonix BID and GERD symptoms are now well controlled, but she continues with postprandial nausea with all meals. Associated diaphoresis but no vomiting. Weight down 4 lbs over the last month due to decreased intake secondary to nausea. Also endorsing early satiety. Denies abdominal pain or dysphagia.  Workup thus far has included modified barium swallow study with transient delay of barium tablet near transaortic arch but cleared with further swallows. Planned to complete EGD at last visit, but patient and mother did not want to have COVID testing, so UGI was pursued.  UGI on 11/08/2018 with small type I hiatal hernia, no other significant findings.  Labs on 11/01/2018 with negative celiac and H. Pylori; normal thyroid, lipase, LFTs, and kidney function.  Etiology is not clear.  We will plan to pursue right upper quadrant ultrasound as well as HIDA scan to evaluate for possible biliary etiology.  Gastroparesis is also on differential with early satiety.  Consider gastric emptying study if [place] and HIDA are unrevealing. Ultimately, patient may need EGD if further evaluation is unrevealing.  Continue Protonix twice daily. Try Zofran 4 mg before dinner as this is when symptoms are the worst. Follow GERD diet.  Handout provided. Follow-up in 4 to 6 weeks.

## 2018-12-12 ENCOUNTER — Encounter (HOSPITAL_COMMUNITY): Payer: Self-pay

## 2018-12-12 ENCOUNTER — Other Ambulatory Visit: Payer: Self-pay

## 2018-12-12 ENCOUNTER — Encounter (HOSPITAL_COMMUNITY)
Admission: RE | Admit: 2018-12-12 | Discharge: 2018-12-12 | Disposition: A | Discharge status: Home / Self Care | Payer: Medicaid Other | Source: Ambulatory Visit | Attending: Gastroenterology | Admitting: Gastroenterology

## 2018-12-12 DIAGNOSIS — R110 Nausea: Secondary | ICD-10-CM | POA: Insufficient documentation

## 2018-12-12 MED ORDER — TECHNETIUM TC 99M MEBROFENIN IV KIT
5.0000 | PACK | Freq: Once | INTRAVENOUS | Status: AC | PRN
  Administered 2018-12-12: 10:00:00 5.1 via INTRAVENOUS

## 2018-12-16 ENCOUNTER — Other Ambulatory Visit: Payer: Self-pay | Admitting: *Deleted

## 2018-12-16 DIAGNOSIS — R110 Nausea: Secondary | ICD-10-CM

## 2018-12-16 NOTE — Progress Notes (Signed)
cc'ed to pcp °

## 2018-12-18 ENCOUNTER — Encounter (HOSPITAL_COMMUNITY): Payer: Self-pay

## 2018-12-18 ENCOUNTER — Encounter (HOSPITAL_COMMUNITY)
Admission: RE | Admit: 2018-12-18 | Discharge: 2018-12-18 | Disposition: A | Discharge status: Home / Self Care | Payer: Medicaid Other | Source: Ambulatory Visit | Attending: Gastroenterology | Admitting: Gastroenterology

## 2018-12-18 ENCOUNTER — Other Ambulatory Visit: Payer: Self-pay

## 2018-12-18 DIAGNOSIS — R110 Nausea: Secondary | ICD-10-CM | POA: Insufficient documentation

## 2018-12-18 MED ORDER — TECHNETIUM TC 99M SULFUR COLLOID
2.0000 | Freq: Once | INTRAVENOUS | Status: AC | PRN
  Administered 2018-12-18: 2.1 via ORAL

## 2018-12-25 ENCOUNTER — Telehealth: Payer: Self-pay | Admitting: Internal Medicine

## 2018-12-25 ENCOUNTER — Ambulatory Visit: Payer: Medicaid Other | Admitting: Gastroenterology

## 2018-12-25 NOTE — Telephone Encounter (Signed)
AB, the gastric Emptying study note says the Julie Richmond can move forward with the EGD. [facility] is out of the office and an order is needed.  Julie Richmond's mother wants to schedule this EGD as soon as possible due to her daughter continuing to have abdominal pain. Julie Richmond is taking Protonix and Zofran for the nausea. MB, also sent a note to [facility]. Please advise on if the order should wait until [facility] returns in the office?

## 2018-12-25 NOTE — Telephone Encounter (Signed)
Spoke with pt's mother. Pt's mother is aware that our office will contact her when the procedure is scheduled.

## 2018-12-25 NOTE — Telephone Encounter (Signed)
All imaging thus far unrevealing.   Needs EGD with Propofol by Dr. Gala Romney due to postprandial nausea, weight loss, early satiety.

## 2018-12-25 NOTE — Telephone Encounter (Signed)
Pt was seen recently and still having problems per mother. Stanley wanted pt to follow up in 4-6 weeks and has OV in OCT. Pt' s mother is wanting to schedule her procedure this week. The schedulers are waiting on recommendations from [facility]. Please advise if I need to bring her back into the office sooner. [phone]

## 2018-12-25 NOTE — Telephone Encounter (Signed)
LMOVM for endo scheduler to discuss scheduling.

## 2018-12-25 NOTE — Telephone Encounter (Signed)
Pt had OV on 9/14 with [facility] and was recommended to follow up in 4-6 weeks. OV is scheduled to come back on 10/21. Mother said that someone called her and said that we were going to call her back to schedule the patient's procedure. I did not see any phone notes saying that. I asked the mother was it someone from this office that called and she said yes, but doesn't remember who it was. Please advise. [phone]

## 2018-12-25 NOTE — Telephone Encounter (Signed)
Routing to [facility] for advice. 

## 2018-12-26 ENCOUNTER — Other Ambulatory Visit: Payer: Self-pay

## 2018-12-26 DIAGNOSIS — R110 Nausea: Secondary | ICD-10-CM

## 2018-12-26 DIAGNOSIS — R634 Abnormal weight loss: Secondary | ICD-10-CM

## 2018-12-26 DIAGNOSIS — R6881 Early satiety: Secondary | ICD-10-CM

## 2018-12-26 NOTE — Telephone Encounter (Signed)
Called pt's mother, EGD w/Propofol w/RMR scheduled for 01/06/19 at 3:15pm. Orders entered.

## 2018-12-27 NOTE — Telephone Encounter (Signed)
Pre-op appt 01/03/19 at 12:45pm, COVID test 1:45pm. Appt letter mailed with procedure instructions.

## 2018-12-27 NOTE — Telephone Encounter (Signed)
Called pt's mother, informed her of pre-op appt and COVID test appt. Procedure instructions also given in case she doesn't receive packet in mail in time.

## 2018-12-30 NOTE — Telephone Encounter (Signed)
Please see other phone note

## 2018-12-30 NOTE — Telephone Encounter (Signed)
Noted  

## 2018-12-31 ENCOUNTER — Telehealth: Payer: Self-pay

## 2018-12-31 NOTE — Telephone Encounter (Signed)
Called pt's mother, EGD w/Propofol for 01/06/19 moved up to 7:30am. Advised her to be NPO after midnight prior to procedure.

## 2019-01-02 NOTE — Patient Instructions (Signed)
Julie Richmond  01/02/2019     @PREFPERIOPPHARMACY @   Your procedure is scheduled on  01/06/2019   [facility] to [facility]  Call this number if you have problems the morning of surgery:  [phone]   Remember:  Do not eat or drink after midnight                   Take these medicines the morning of surgery with A SIP OF WATER  Zofran(if needed), protonix.    Do not wear jewelry, make-up or nail polish.  Do not wear lotions, powders, or perfumes. Please wear deodorant and brush your teeth.  Do not shave 48 hours prior to surgery.  Men may shave face and neck.  Do not bring valuables to the hospital.  [facility] is not responsible for any belongings or valuables.  Contacts, dentures or bridgework may not be worn into surgery.  Leave your suitcase in the car.  After surgery it may be brought to your room.  For patients admitted to the hospital, discharge time will be determined by your treatment team.  Patients discharged the day of surgery will not be allowed to drive home.   Name and phone number of your driver:  family Special instructions:  None  Please read over the following fact sheets that you were given. Anesthesia Post-op Instructions and Care and Recovery After Surgery       Upper Endoscopy, Adult, Care After This sheet gives you information about how to care for yourself after your procedure. Your health care provider may also give you more specific instructions. If you have problems or questions, contact your health care provider. What can I expect after the procedure? After the procedure, it is common to have:  A sore throat.  Mild stomach pain or discomfort.  Bloating.  Nausea. Follow these instructions at home:   Follow instructions from your health care provider about what to eat or drink after your procedure.  Return to your normal activities as told by your health care provider. Ask your health care provider what  activities are safe for you.  Take over-the-counter and prescription medicines only as told by your health care provider.  Do not drive for 24 hours if you were given a sedative during your procedure.  Keep all follow-up visits as told by your health care provider. This is important. Contact a health care provider if you have:  A sore throat that lasts longer than one day.  Trouble swallowing. Get help right away if:  You vomit blood or your vomit looks like coffee grounds.  You have: ? A fever. ? Bloody, black, or tarry stools. ? A severe sore throat or you cannot swallow. ? Difficulty breathing. ? Severe pain in your chest or abdomen. Summary  After the procedure, it is common to have a sore throat, mild stomach discomfort, bloating, and nausea.  Do not drive for 24 hours if you were given a sedative during the procedure.  Follow instructions from your health care provider about what to eat or drink after your procedure.  Return to your normal activities as told by your health care provider. This information is not intended to replace advice given to you by your health care provider. Make sure you discuss any questions you have with your health care provider. Document Released: 09/12/2011 Document Revised: 09/04/2017 Document Reviewed: 08/13/2017 Elsevier Patient Education  2020 [employer]. Monitored Anesthesia  Care, Care After These instructions provide you with information about caring for yourself after your procedure. Your health care provider may also give you more specific instructions. Your treatment has been planned according to current medical practices, but problems sometimes occur. Call your health care provider if you have any problems or questions after your procedure. What can I expect after the procedure? After your procedure, you may:  Feel sleepy for several hours.  Feel clumsy and have poor balance for several hours.  Feel forgetful about what  happened after the procedure.  Have poor judgment for several hours.  Feel nauseous or vomit.  Have a sore throat if you had a breathing tube during the procedure. Follow these instructions at home: For at least 24 hours after the procedure:      Have a responsible adult stay with you. It is important to have someone help care for you until you are awake and alert.  Rest as needed.  Do not: ? Participate in activities in which you could fall or become injured. ? Drive. ? Use heavy machinery. ? Drink alcohol. ? Take sleeping pills or medicines that cause drowsiness. ? Make important decisions or sign legal documents. ? Take care of children on your own. Eating and drinking  Follow the diet that is recommended by your health care provider.  If you vomit, drink water, juice, or soup when you can drink without vomiting.  Make sure you have little or no nausea before eating solid foods. General instructions  Take over-the-counter and prescription medicines only as told by your health care provider.  If you have sleep apnea, surgery and certain medicines can increase your risk for breathing problems. Follow instructions from your health care provider about wearing your sleep device: ? Anytime you are sleeping, including during daytime naps. ? While taking prescription pain medicines, sleeping medicines, or medicines that make you drowsy.  If you smoke, do not smoke without supervision.  Keep all follow-up visits as told by your health care provider. This is important. Contact a health care provider if:  You keep feeling nauseous or you keep vomiting.  You feel light-headed.  You develop a rash.  You have a fever. Get help right away if:  You have trouble breathing. Summary  For several hours after your procedure, you may feel sleepy and have poor judgment.  Have a responsible adult stay with you for at least 24 hours or until you are awake and alert. This  information is not intended to replace advice given to you by your health care provider. Make sure you discuss any questions you have with your health care provider. Document Released: 07/04/2015 Document Revised: 06/11/2017 Document Reviewed: 07/04/2015 Elsevier Patient Education  2020 [employer].

## 2019-01-03 ENCOUNTER — Other Ambulatory Visit (HOSPITAL_COMMUNITY)
Admission: RE | Admit: 2019-01-03 | Discharge: 2019-01-03 | Disposition: A | Discharge status: Home / Self Care | Payer: Medicaid Other | Source: Ambulatory Visit | Attending: Internal Medicine | Admitting: Internal Medicine

## 2019-01-03 ENCOUNTER — Encounter (HOSPITAL_COMMUNITY)
Admission: RE | Admit: 2019-01-03 | Discharge: 2019-01-03 | Disposition: A | Discharge status: Home / Self Care | Payer: Medicaid Other | Source: Ambulatory Visit | Attending: Internal Medicine | Admitting: Internal Medicine

## 2019-01-03 ENCOUNTER — Encounter (HOSPITAL_COMMUNITY): Payer: Self-pay

## 2019-01-03 ENCOUNTER — Other Ambulatory Visit: Payer: Self-pay

## 2019-01-03 DIAGNOSIS — K449 Diaphragmatic hernia without obstruction or gangrene: Secondary | ICD-10-CM | POA: Insufficient documentation

## 2019-01-03 DIAGNOSIS — Z20828 Contact with and (suspected) exposure to other viral communicable diseases: Secondary | ICD-10-CM | POA: Insufficient documentation

## 2019-01-03 DIAGNOSIS — Z01812 Encounter for preprocedural laboratory examination: Secondary | ICD-10-CM | POA: Insufficient documentation

## 2019-01-03 DIAGNOSIS — K219 Gastro-esophageal reflux disease without esophagitis: Secondary | ICD-10-CM | POA: Insufficient documentation

## 2019-01-03 HISTORY — DX: Unspecified convulsions: R56.9

## 2019-01-03 LAB — SARS CORONAVIRUS 2 (TAT 6-24 HRS): SARS Coronavirus 2: NEGATIVE

## 2019-01-03 LAB — HCG, SERUM, QUALITATIVE: Preg, Serum: NEGATIVE

## 2019-01-06 ENCOUNTER — Ambulatory Visit (HOSPITAL_COMMUNITY): Payer: Medicaid Other | Admitting: Anesthesiology

## 2019-01-06 ENCOUNTER — Encounter (HOSPITAL_COMMUNITY)
Admission: RE | Disposition: A | Discharge status: Home / Self Care | Payer: Self-pay | Source: Home / Self Care | Attending: Internal Medicine

## 2019-01-06 ENCOUNTER — Encounter (HOSPITAL_COMMUNITY): Payer: Self-pay | Admitting: *Deleted

## 2019-01-06 ENCOUNTER — Ambulatory Visit (HOSPITAL_COMMUNITY)
Admission: RE | Admit: 2019-01-06 | Discharge: 2019-01-06 | Disposition: A | Discharge status: Home / Self Care | Payer: Medicaid Other | Attending: Internal Medicine | Admitting: Internal Medicine

## 2019-01-06 DIAGNOSIS — K319 Disease of stomach and duodenum, unspecified: Secondary | ICD-10-CM | POA: Insufficient documentation

## 2019-01-06 DIAGNOSIS — R634 Abnormal weight loss: Secondary | ICD-10-CM

## 2019-01-06 DIAGNOSIS — Z7722 Contact with and (suspected) exposure to environmental tobacco smoke (acute) (chronic): Secondary | ICD-10-CM | POA: Diagnosis not present

## 2019-01-06 DIAGNOSIS — G809 Cerebral palsy, unspecified: Secondary | ICD-10-CM | POA: Diagnosis not present

## 2019-01-06 DIAGNOSIS — Z79899 Other long term (current) drug therapy: Secondary | ICD-10-CM | POA: Insufficient documentation

## 2019-01-06 DIAGNOSIS — R6881 Early satiety: Secondary | ICD-10-CM

## 2019-01-06 DIAGNOSIS — R12 Heartburn: Secondary | ICD-10-CM | POA: Insufficient documentation

## 2019-01-06 DIAGNOSIS — K209 Esophagitis, unspecified without bleeding: Secondary | ICD-10-CM | POA: Diagnosis not present

## 2019-01-06 DIAGNOSIS — K449 Diaphragmatic hernia without obstruction or gangrene: Secondary | ICD-10-CM | POA: Diagnosis not present

## 2019-01-06 DIAGNOSIS — K2100 Gastro-esophageal reflux disease with esophagitis, without bleeding: Secondary | ICD-10-CM | POA: Insufficient documentation

## 2019-01-06 DIAGNOSIS — R110 Nausea: Secondary | ICD-10-CM | POA: Insufficient documentation

## 2019-01-06 HISTORY — PX: ESOPHAGOGASTRODUODENOSCOPY (EGD) WITH PROPOFOL: SHX5813

## 2019-01-06 HISTORY — PX: BIOPSY: SHX5522

## 2019-01-06 SURGERY — ESOPHAGOGASTRODUODENOSCOPY (EGD) WITH PROPOFOL
Anesthesia: General

## 2019-01-06 MED ORDER — HYDROMORPHONE HCL 1 MG/ML IJ SOLN: 0.2500 mg | INTRAMUSCULAR | Status: DC | PRN

## 2019-01-06 MED ORDER — LIDOCAINE HCL (CARDIAC) PF 100 MG/5ML IV SOSY
PREFILLED_SYRINGE | INTRAVENOUS | Status: DC | PRN
  Administered 2019-01-06: 40 mg via INTRAVENOUS

## 2019-01-06 MED ORDER — ONDANSETRON HCL 4 MG/2ML IJ SOLN
INTRAMUSCULAR | Status: DC | PRN
  Administered 2019-01-06: 4 mg via INTRAVENOUS

## 2019-01-06 MED ORDER — PROPOFOL 10 MG/ML IV BOLUS
INTRAVENOUS | Status: AC
  Filled 2019-01-06: qty 20

## 2019-01-06 MED ORDER — CHLORHEXIDINE GLUCONATE CLOTH 2 % EX PADS: 6.0000 | MEDICATED_PAD | Freq: Once | CUTANEOUS | Status: DC

## 2019-01-06 MED ORDER — PROMETHAZINE HCL 25 MG/ML IJ SOLN: 6.2500 mg | INTRAMUSCULAR | Status: DC | PRN

## 2019-01-06 MED ORDER — HYDROCODONE-ACETAMINOPHEN 7.5-325 MG PO TABS: 1.0000 | ORAL_TABLET | Freq: Once | ORAL | Status: DC | PRN

## 2019-01-06 MED ORDER — PROPOFOL 10 MG/ML IV BOLUS
INTRAVENOUS | Status: AC
  Filled 2019-01-06: qty 60

## 2019-01-06 MED ORDER — ONDANSETRON HCL 4 MG/2ML IJ SOLN
INTRAMUSCULAR | Status: AC
  Filled 2019-01-06: qty 2

## 2019-01-06 MED ORDER — PROPOFOL 10 MG/ML IV BOLUS
INTRAVENOUS | Status: DC | PRN
  Administered 2019-01-06: 30 mg via INTRAVENOUS
  Administered 2019-01-06: 20 mg via INTRAVENOUS
  Administered 2019-01-06 (×2): 50 mg via INTRAVENOUS

## 2019-01-06 MED ORDER — LACTATED RINGERS IV SOLN
INTRAVENOUS | Status: DC
  Administered 2019-01-06: 1000 mL via INTRAVENOUS

## 2019-01-06 MED ORDER — MIDAZOLAM HCL 2 MG/2ML IJ SOLN: 0.5000 mg | Freq: Once | INTRAMUSCULAR | Status: DC | PRN

## 2019-01-06 NOTE — Addendum Note (Signed)
Addendum  created 01/06/19 1445 by Ollen Bowl, CRNA   Clinical Note Signed

## 2019-01-06 NOTE — Discharge Instructions (Signed)

## 2019-01-06 NOTE — Anesthesia Postprocedure Evaluation (Addendum)
Anesthesia Post Note  Patient: Julie Richmond  Procedure(s) Performed: ESOPHAGOGASTRODUODENOSCOPY (EGD) WITH PROPOFOL (N/A ) BIOPSY  Patient location during evaluation: PACU Anesthesia Type: General Level of consciousness: awake and alert Pain management: pain level controlled Vital Signs Assessment: post-procedure vital signs reviewed and stable Respiratory status: spontaneous breathing, nonlabored ventilation, respiratory function stable and patient connected to nasal cannula oxygen Cardiovascular status: stable and blood pressure returned to baseline Postop Assessment: no apparent nausea or vomiting Anesthetic complications: no     Last Vitals:  Vitals:   01/06/19 0646  BP: 131/78  Pulse: (!) 129  Resp: 20  Temp: 37.1 C  SpO2: 100%    Last Pain:  Vitals:   01/06/19 0742  TempSrc:   PainSc: 0-No pain                 Talitha Givens

## 2019-01-06 NOTE — H&P (Signed)
@LOGO @   Primary Care Physician:  , MD Primary Gastroenterologist:  Dr. [name]  Pre-Procedure History & Physical: HPI:  Julie Richmond is a 23 y.o. female here for further evaluation of nausea without vomiting.  Starts to have nauseated gets diaphoretic within 1 mouth once daily and swallowed food.  Upper GI, ultrasound, HIDA and GES all normal.  Happens all the time.  Reflux for 2 years.  She was good with the ranitidine and was switched out to Pepcid and omeprazole and now pantoprazole.  No dysphagia.  Discussed with mother at length.  EGD to wrap up this phase of the evaluation.  Past Medical History:  Diagnosis Date  . Blind   . Cerebral palsy (HCC)   . Encounter for menstrual regulation 10/08/2015  . Menstrual extraction 09/08/2013  . 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 hamstrings lengthened Bilateral     Prior to Admission medications   Medication Sig Start Date End Date Taking? Authorizing Provider  ibuprofen (ADVIL,MOTRIN) 200 MG tablet Take 400-600 mg by mouth every 8 (eight) hours as needed (headaches.).    Yes [provider]  ondansetron (ZOFRAN) 4 MG tablet Take 1 tablet (4 mg total) by mouth every 8 (eight) hours as needed for nausea or vomiting. 12/09/18  Yes 12/11/18, PA-C  pantoprazole (PROTONIX) 40 MG tablet Take 1 tablet (40 mg total) by mouth 2 (two) times daily. 12/09/18  Yes 12/11/18, PA-C    Allergies as of 12/26/2018  . (No Known Allergies)    Family History  Problem Relation Age of Onset  . Diabetes Maternal Grandfather   . COPD Paternal Grandmother   . Cancer Paternal Grandmother        intestines. Had colostomy. Passed form cancer at age 12.   . Inflammatory bowel disease Neg Hx   . Celiac disease Neg Hx   . Colon cancer Neg Hx     Social History   Socioeconomic History  . Marital status: Single    Spouse name: Not on file  . Number of  children: Not on file  . Years of education: Not on file  . Highest education level: Not on file  Occupational History  . Not on file  Social Needs  . Financial resource strain: Not on file  . Food insecurity    Worry: Not on file    Inability: Not on file  . Transportation needs    Medical: Not on file    Non-medical: Not on file  Tobacco Use  . Smoking status: Passive Smoke Exposure - Never Smoker  . Smokeless tobacco: Never Used  Substance and Sexual Activity  . Alcohol use: No  . Drug use: No  . Sexual activity: Never    Birth control/protection: None  Lifestyle  . Physical activity    Days per week: Not on file    Minutes per session: Not on file  . Stress: Not on file  Relationships  . Social 59 on phone: Not on file    Gets together: Not on file    Attends religious service: Not on file    Active member of club or organization: Not on file    Attends meetings of clubs or organizations: Not on file    Relationship status: Not on file  . Intimate partner violence    Fear of current or ex partner: Not  on file    Emotionally abused: Not on file    Physically abused: Not on file    Forced sexual activity: Not on file  Other Topics Concern  . Not on file  Social History Narrative   Lives with parents, younger sister       No pets       Dad smokes       No school or work     Review of Systems: See HPI, otherwise negative ROS  Physical Exam: BP 131/78   Pulse (!) 129   Temp 98.8 F (37.1 C) (Oral)   Resp 20   Ht 5\' 4"  (1.626 m)   Wt 47.6 kg   SpO2 (P) 100%   BMI 18.02 kg/m  General:   Alert,  Well-developed, well-nourished, pleasant and cooperative in NAD Neck:  Supple; no masses or thyromegaly. No significant cervical adenopathy. Lungs:  Clear throughout to auscultation.   No wheezes, crackles, or rhonchi. No acute distress. Heart:  Regular rate and rhythm; no murmurs, clicks, rubs,  or gallops. Abdomen: Non-distended, normal  bowel sounds.  Soft and nontender without appreciable mass or hepatosplenomegaly.  Pulses:  Normal pulses noted. Extremities:  Without clubbing or edema.  Impression/Plan: 23 year old lady with sensation symptom of nausea as soon as she starts to attempt to consume a meal.  Happens within seconds attempting to eat.  No abdominal pain.  She is never vomited.  History of GERD without major symptoms at this time.  No dysphagia.  A functional component is not ruled out.  Recommendations:   Plan for diagnostic EGD today per original plan.  Discussed with patient and mother at length.  The risks, benefits, limitations, alternatives and imponderables have been reviewed with the patient. Potential for esophageal dilation, biopsy, etc. have also been reviewed.  Questions have been answered. All parties agreeable.   Notice: This dictation was prepared with Dragon dictation along with smaller phrase technology. Any transcriptional errors that result from this process are unintentional and may not be corrected upon review.

## 2019-01-06 NOTE — Op Note (Signed)
[facility] Patient Name: Julie Richmond Procedure Date: 01/06/2019 7:07 AM MRN: [id number] Date of Birth: 07-07-1995 Attending MD: Gennette Pac , MD CSN: [id number] Age: 23 Admit Type: Outpatient Procedure:                Upper GI endoscopy Indications:              Heartburn, Nausea Providers:                Gennette Pac, MD, Criselda Peaches. Patsy Lager, RN,                            Dyann Ruddle Referring MD:              Medicines:                Propofol per Anesthesia Complications:            No immediate complications. Estimated Blood Loss:     Estimated blood loss was minimal. Procedure:                Pre-Anesthesia Assessment:                           - Prior to the procedure, a History and Physical                            was performed, and patient medications and                            allergies were reviewed. The patient's tolerance of                            previous anesthesia was also reviewed. The risks                            and benefits of the procedure and the sedation                            options and risks were discussed with the patient.                            All questions were answered, and informed consent                            was obtained. Prior Anticoagulants: The patient has                            taken no previous anticoagulant or antiplatelet                            agents. ASA Grade Assessment: III - A patient with                            severe systemic disease. After reviewing the risks  and benefits, the patient was deemed in                            satisfactory condition to undergo the procedure.                           After obtaining informed consent, the endoscope was                            passed under direct vision. Throughout the                            procedure, the patient's blood pressure, pulse, and                            oxygen saturations  were monitored continuously. The                            [id number]-H190 ([id number]) scope was introduced through the                            mouth, and advanced to the second part of duodenum.                            The upper GI endoscopy was accomplished without                            difficulty. The patient tolerated the procedure                            well. Scope In: 7:47:15 AM Scope Out: 7:52:38 AM Total Procedure Duration: 0 hours 5 minutes 23 seconds  Findings:      Esophagitis was found. Tiny erosions straddling the GE junction. EG       junction patulous. No Barrett's epithelium seen.      A small hiatal hernia was present. Nodular antral mucosa. No ulcer or       infiltrating process seen. Biopsies taken.      The duodenal bulb and second portion of the duodenum were normal. Impression:               -Mild erosive reflux esophagitis. Patulous EG                            junction. Small hiatal hernia. Nodular gastric                            mucosa of doubtful clinical significance?"status                            post biopsy.                           Patient's mother tells me her posture has worsened  this year where she "slouches" quite a bit forward                            when she is in the chair trying to eat. Significant                            angulation. This may have something to do with her                            symptoms although they are not typical for reflux                            gastroparesis or rumination. She has absolutely no                            abdominal pain. No improvement with Protonix                            recently. Moderate Sedation:      Moderate (conscious) sedation was personally administered by an       anesthesia professional. The following parameters were monitored: oxygen       saturation, heart rate, blood pressure, respiratory rate, EKG, adequacy       of pulmonary  ventilation, and response to care. Recommendation:           - Patient has a contact number available for                            emergencies. The signs and symptoms of potential                            delayed complications were discussed with the                            patient. Return to normal activities tomorrow.                            Written discharge instructions were provided to the                            patient.                           - Advance diet as tolerated. Stop Protonix; begin                            Dexilant 60 mg daily. Add Carafate suspension 1 g                            before meals and at bedtime. Promote good posture                            with patient remaining as upright as possible in  her chair with meals and for 30 minutes thereafter.                            Patient's mother is to call me in 2 weeks and let                            me know how she is doing and we will go from there. Procedure Code(s):        --- Professional ---                           [phone], Esophagogastroduodenoscopy, flexible,                            transoral; with biopsy, single or multiple Diagnosis Code(s):        --- Professional ---                           K20.9, Esophagitis, unspecified                           K44.9, Diaphragmatic hernia without obstruction or                            gangrene                           R12, Heartburn                           R11.0, Nausea CPT copyright 2019 American Medical Association. All rights reserved. The codes documented in this report are preliminary and upon coder review may  be revised to meet current compliance requirements. Cristopher Estimable. Annabeth Tortora, MD Norvel Richards, MD 01/06/2019 8:14:23 AM This report has been signed electronically. Number of Addenda: 0

## 2019-01-06 NOTE — Addendum Note (Signed)
Addendum  created 01/06/19 1603 by Veora Fonte E, CRNA   Clinical Note Signed    

## 2019-01-06 NOTE — Anesthesia Preprocedure Evaluation (Signed)
Anesthesia Evaluation  Patient identified by MRN, date of birth, ID band Patient awake    Reviewed: Allergy & Precautions, NPO status , Patient's Chart, lab work & pertinent test results  Airway Mallampati: II  TM Distance: >3 FB Neck ROM: Full    Dental no notable dental hx. (+) Teeth Intact   Pulmonary neg pulmonary ROS,    Pulmonary exam normal breath sounds clear to auscultation       Cardiovascular Exercise Tolerance: Good negative cardio ROS Normal cardiovascular examI Rhythm:Regular Rate:Normal  Uses walker   Neuro/Psych Seizures -, Well Controlled,  States ~20 years prior - no current Sz meds negative psych ROS   GI/Hepatic Neg liver ROS, GERD  Medicated and Controlled,  Endo/Other  negative endocrine ROS  Renal/GU negative Renal ROS  negative genitourinary   Musculoskeletal negative musculoskeletal ROS (+)   Abdominal   Peds negative pediatric ROS (+)  Hematology negative hematology ROS (+)   Anesthesia Other Findings   Reproductive/Obstetrics negative OB ROS                             Anesthesia Physical Anesthesia Plan  ASA: II  Anesthesia Plan: General   Post-op Pain Management:    Induction: Intravenous  PONV Risk Score and Plan: 3 and TIVA, Propofol infusion, Ondansetron, Treatment may vary due to age or medical condition and Midazolam  Airway Management Planned: Nasal Cannula and Simple Face Mask  Additional Equipment:   Intra-op Plan:   Post-operative Plan:   Informed Consent: I have reviewed the patients History and Physical, chart, labs and discussed the procedure including the risks, benefits and alternatives for the proposed anesthesia with the patient or authorized representative who has indicated his/her understanding and acceptance.     Dental advisory given  Plan Discussed with: CRNA  Anesthesia Plan Comments: (Plan Full PPE use  Plan GA with  GETA as needed d/w pt -WTP with same after Q&A)        Anesthesia Quick Evaluation

## 2019-01-06 NOTE — Transfer of Care (Addendum)
Immediate Anesthesia Transfer of Care Note  Patient: Julie Richmond  Procedure(s) Performed: ESOPHAGOGASTRODUODENOSCOPY (EGD) WITH PROPOFOL (N/A ) BIOPSY  Patient Location: PACU  Anesthesia Type:General  Level of Consciousness: awake, alert  and oriented  Airway & Oxygen Therapy: Patient Spontanous Breathing and Patient connected to nasal cannula oxygen  Post-op Assessment: Report given to RN and Post -op Vital signs reviewed and stable  Post vital signs: Reviewed and stable  Last Vitals:  Vitals Value Taken Time  BP 105/67 01/06/19 0800  Temp    Pulse 96 01/06/19 0801  Resp 26 01/06/19 0801  SpO2 99 % 01/06/19 0801  Vitals shown include unvalidated device data.  Last Pain:  Vitals:   01/06/19 0742  TempSrc:   PainSc: 0-No pain      Patients Stated Pain Goal: 7 (83/38/25 0539)  Complications: No apparent anesthesia complications

## 2019-01-07 ENCOUNTER — Other Ambulatory Visit: Payer: Self-pay

## 2019-01-07 LAB — SURGICAL PATHOLOGY

## 2019-01-08 ENCOUNTER — Encounter: Payer: Self-pay | Admitting: Internal Medicine

## 2019-01-13 ENCOUNTER — Encounter (HOSPITAL_COMMUNITY): Payer: Self-pay | Admitting: Internal Medicine

## 2019-01-14 NOTE — Progress Notes (Signed)
Referring Provider: Merrily Brittle,* Primary Care Physician:  [name], MD Primary GI Physician: Dr. Jena Gauss  Chief Complaint  Patient presents with  . Nausea    HPI:   Julie Richmond is a 23 y.o. female presenting today with a history of spastic diplegic cerebral palsy, GERD, and postprandial nausea without vomiting who is presenting for follow-up of postprandial nausea.  She was last seen in our office on 12/09/2018 for the same.  Prior workup has included MBSS on 09/26/2018 revealing 1 episode of retrograde movement of thin liquids and barium tablet transiently delayed near transthoracic arch but cleared with liquid wash. Celiac serologies and H. pylori negative on 11/01/2018. UGI series on 8/14 with small type I hiatal hernia.  No other significant findings.  At that time patient increase Protonix to twice daily.  At last visit, GERD symptoms are well controlled, but she continued with nausea after meals with 4 pound weight loss over the last month.  EGD had been scheduled in the past but patient and patient's mother wanted EGD as a last resort.  Plans to pursue RUQ [place] and HIDA.  RUQ [place] on 12/10/2018 normal. HIDA on 12/12/2018 normal. Gastric emptying study on 12/18/2018 normal.  EGD on 01/06/2019 with mild erosive reflux esophagitis.  Patulous EG junction.  Small hiatal hernia.  Nodular gastric mucosa of doubtful clinical significance status post biopsy.  Patient's mother reported worsening posture with slouching forward while eating.  Pathology with mild reactive gastropathy and intestinal metaplasia present focally, negative for dysplasia and H. pylori.  Recommendations to stop Protonix and start Dexilant 60 mg daily.  Add Carafate suspension 1 g before meals and at bedtime.  Promote good posture with patient remaining upright as possible in her chair with meals and for 30 minutes thereafter.  Advised patient's mother to call in 2 weeks with a progress report. No progress report  received.   Today she states she is having nausea and sweating after most meal. No issues with liquids. No vomiting. Symptoms last "a while." Can't tell me the time frame. Thinks maybe an hour or more. Has been sitting up straight with eating. No improvement. Dexilant and carafate are not making a difference.  Patient does not want to continue Carafate. Often will feel like she has to have a BM after eating. Sometimes she will have a BM, other times she will not. 1-2 BMs a day typically. May go a couple days without BM. Stools are soft and formed. Rarely runny. If eating [place], it will run right through her. No blood in the stool or melena. No abdominal pain. No further weight loss.   Tried taking zofran prior to eating. Was still nauseous. She is not taking Zofran at this time. Just deals with the nausea. Not really watching her diet. Eating mexican, fried, and foods with high fat content. Also drinking cheerwine, sundrop, and tea. Consumes dairy. Had sausage biscuit this morning, pork and scalloped potatoes last night.     Past Medical History:  Diagnosis Date  . Blind   . Cerebral palsy (HCC)   . Encounter for menstrual regulation 10/08/2015  . Menstrual extraction 09/08/2013  . 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 BIOPSY  01/06/2019   Surgeon: [name], MD; mild reactive gastropathy and intestinal metaplasia present focally, negative for dysplasia and H. pylori.  . ESOPHAGOGASTRODUODENOSCOPY (EGD) WITH PROPOFOL N/A 01/06/2019  PROPOFOL;  Surgeon: Corbin Adeourk, Robert M, MD; nonerosive reflux esophagitis, small hiatal hernia, nodular gastric mucosa s/p biopsy.  Pathology with mild reactive gastropathy and intestinal metaplasia present focally, negative for dysplasia and H. pylori.  . hamstrings lengthened Bilateral     Current Outpatient Medications  Medication Sig Dispense Refill  . dexlansoprazole (DEXILANT) 60 MG capsule  Take 60 mg by mouth daily.    Marland Kitchen. ibuprofen (ADVIL,MOTRIN) 200 MG tablet Take 400-600 mg by mouth as needed (headaches.).     Marland Kitchen. ondansetron (ZOFRAN) 4 MG tablet Take 1 tablet (4 mg total) by mouth every 8 (eight) hours as needed for nausea or vomiting. 30 tablet 1  . sucralfate (CARAFATE) 1 GM/10ML suspension Take 1 g by mouth 4 (four) times daily -  before meals and at bedtime.     No current facility-administered medications for this visit.     Allergies as of 01/15/2019  . (No Known Allergies)    Family History  Problem Relation Age of Onset  . Diabetes Maternal Grandfather   . COPD Paternal Grandmother   . Cancer Paternal Grandmother        intestines. Had colostomy. Passed form cancer at age 23.   . Inflammatory bowel disease Neg Hx   . Celiac disease Neg Hx   . Colon cancer Neg Hx     Social History   Socioeconomic History  . Marital status: Single    Spouse name: Not on file  . Number of children: Not on file  . Years of education: Not on file  . Highest education level: Not on file  Occupational History  . Not on file  Social Needs  . Financial resource strain: Not on file  . Food insecurity    Worry: Not on file    Inability: Not on file  . Transportation needs    Medical: Not on file    Non-medical: Not on file  Tobacco Use  . Smoking status: Passive Smoke Exposure - Never Smoker  . Smokeless tobacco: Never Used  Substance and Sexual Activity  . Alcohol use: No  . Drug use: No  . Sexual activity: Never    Birth control/protection: None  Lifestyle  . Physical activity    Days per week: Not on file    Minutes per session: Not on file  . Stress: Not on file  Relationships  . Social Musicianconnections    Talks on phone: Not on file    Gets together: Not on file    Attends religious service: Not on file    Active member of club or organization: Not on file    Attends meetings of clubs or organizations: Not on file    Relationship status: Not on file  Other  Topics Concern  . Not on file  Social History Narrative   Lives with parents, younger sister       No pets       Dad smokes       No school or work     Review of Systems: Gen: Denies fever, chills, lightheadedness, dizziness, or feeling like she will pass out. CV: Denies chest pain, palpitations Resp: Denies dyspnea or cough. GI: See HPI Derm: Denies rash Psych: Denies depression, anxiety Heme: Denies bruising, bleeding  Physical Exam: BP 127/75   Pulse (!) 122   Temp 98.9 F (37.2 C)   Ht 5\' 4"  (1.626 m)   Wt 117 lb 3.2 oz (53.2 kg)   BMI 20.12 kg/m  General:  Alert and oriented. No distress noted. Pleasant and cooperative.  In a wheelchair. Head:  Normocephalic and atraumatic. Eyes:  Conjuctiva clear without scleral icterus. Heart:  S1, S2 present without murmurs appreciated. Lungs:  Clear to auscultation bilaterally. No wheezes, rales, or rhonchi. No distress.  Abdomen:  +BS, soft, non-tender and non-distended. No rebound or guarding. No HSM or masses noted. Msk:  Symmetrical without gross deformities. Extremities:  Without edema. Neurologic:  Alert and  oriented x4 Psych: Normal mood and affect.

## 2019-01-15 ENCOUNTER — Ambulatory Visit (INDEPENDENT_AMBULATORY_CARE_PROVIDER_SITE_OTHER): Payer: Medicaid Other | Admitting: Gastroenterology

## 2019-01-15 ENCOUNTER — Encounter: Payer: Self-pay | Admitting: Gastroenterology

## 2019-01-15 ENCOUNTER — Other Ambulatory Visit: Payer: Self-pay

## 2019-01-15 VITALS — BP 127/75 | HR 122 | Temp 98.9°F | Ht 64.0 in | Wt 117.2 lb

## 2019-01-15 DIAGNOSIS — R110 Nausea: Secondary | ICD-10-CM

## 2019-01-15 NOTE — Patient Instructions (Addendum)
Continue taking Dexilant 60 mg daily.   Continue taking Zofran as needed for nausea.   You do not have to continue taking carafate at this time.   We need to focus on dietary changes to see if this will help with nausea. We will focus on what we call a GERD diet. Avoid spicy, fatty, fried, greasy, and citrus foods. Meats need to be baked, broiled, or grilled. Avoid caffeine, carbonated beverages, and chocolate. See GERD handout below.   Please call in 2 weeks with a progress report. I will also be discussing with Dr. Gala Romney.   We will plan to follow-up in 6-8 weeks to see how you are doing.   Aliene Altes, PA-C [facility]    Food Choices for Gastroesophageal Reflux Disease, Adult When you have gastroesophageal reflux disease (GERD), the foods you eat and your eating habits are very important. Choosing the right foods can help ease your discomfort. Think about working with a nutrition specialist (dietitian) to help you make good choices. What are tips for following this plan?  Meals  Choose healthy foods that are low in fat, such as fruits, vegetables, whole grains, low-fat dairy products, and lean meat, fish, and poultry.  Eat small meals often instead of 3 large meals a day. Eat your meals slowly, and in a place where you are relaxed. Avoid bending over or lying down until 2-3 hours after eating.  Avoid eating meals 2-3 hours before bed.  Avoid drinking a lot of liquid with meals.  Cook foods using methods other than frying. Bake, grill, or broil food instead.  Avoid or limit: ? Chocolate. ? Peppermint or spearmint. ? Alcohol. ? Pepper. ? Black and decaffeinated coffee. ? Black and decaffeinated tea. ? Bubbly (carbonated) soft drinks. ? Caffeinated energy drinks and soft drinks.  Limit high-fat foods such as: ? Fatty meat or fried foods. ? Whole milk, cream, butter, or ice cream. ? Nuts and nut butters. ? Pastries, donuts, and sweets made with butter  or shortening.  Avoid foods that cause symptoms. These foods may be different for everyone. Common foods that cause symptoms include: ? Tomatoes. ? Oranges, lemons, and limes. ? Peppers. ? Spicy food. ? Onions and garlic. ? Vinegar. Lifestyle  Maintain a healthy weight. Ask your doctor what weight is healthy for you. If you need to lose weight, work with your doctor to do so safely.  Exercise for at least 30 minutes for 5 or more days each week, or as told by your doctor.  Wear loose-fitting clothes.  Do not smoke. If you need help quitting, ask your doctor.  Sleep with the head of your bed higher than your feet. Use a wedge under the mattress or blocks under the bed frame to raise the head of the bed. Summary  When you have gastroesophageal reflux disease (GERD), food and lifestyle choices are very important in easing your symptoms.  Eat small meals often instead of 3 large meals a day. Eat your meals slowly, and in a place where you are relaxed.  Limit high-fat foods such as fatty meat or fried foods.  Avoid bending over or lying down until 2-3 hours after eating.  Avoid peppermint and spearmint, caffeine, alcohol, and chocolate. This information is not intended to replace advice given to you by your health care provider. Make sure you discuss any questions you have with your health care provider. Document Released: 09/12/2011 Document Revised: 07/04/2018 Document Reviewed: 04/18/2016 Elsevier Patient Education  2020 [employer].

## 2019-01-19 ENCOUNTER — Encounter: Payer: Self-pay | Admitting: Gastroenterology

## 2019-01-19 NOTE — Assessment & Plan Note (Addendum)
23 year old female with history of spastic diplegic cerebral palsy and GERD who presents today for follow-up of postprandial nausea.  She has had extensive GI work-up in the last several months including MBSS, UGI series, celiac serologies and H. pylori testing, right upper quadrant ultrasound, HIDA scan, and gastric emptying study without any significant findings to explain postprandial nausea.  Most recently she underwent EGD on 01/06/2019 revealing mild erosive reflux esophagitis, patulous EG junction, small hiatal hernia, nodular gastric mucosa of doubtful clinical significance status post biopsy.  Pathology with mild reactive gastropathy and intestinal metaplasia present focally, negative for dysplasia and H. Pylori.  Recommended changing Protonix to Dexilant, adding Carafate before meals and at bedtime, and promote good posture with meals.  She has had no significant improvement.  Continues with postprandial nausea with diaphoresis.  GERD symptoms continue to be well controlled, no vomiting, no abdominal pain.  BMs typically daily, soft and formed.  Weight is up 4 pounds since last visit in September. No alarm symptoms.   I suspect postprandial nausea may be secondary to underlying GERD and dietary habits as patient continues to consume spicy, fried, fatty foods as well as carbonated beverages and dairy.  At this point, I feel patient needs to focus on dietary changes to see if this will help resolve and/or improve her symptoms. Discussed avoiding spicy, fried, fatty, greasy, and citrus foods.  Meats need to be baked, broiled, or grilled.  Avoid caffeine, carbonated beverages, and chocolate.  GERD handout provided. Continue Dexilant 60 mg. Continue Zofran as needed for nausea. Will discuss further with Dr. Gala Romney.  Requested a progress report in 2 weeks. Follow-up in 6 to 8 weeks.

## 2019-01-31 ENCOUNTER — Telehealth: Payer: Self-pay | Admitting: Internal Medicine

## 2019-01-31 NOTE — Telephone Encounter (Signed)
Noted. Pt is avoiding spicy, fatty, greasy, citrus, chocolate foods. Pt will continue to avoid those foods as directed. Pt's appointment was moved up to 02/14/2019 with RMR per [facility].

## 2019-01-31 NOTE — Telephone Encounter (Signed)
Spoke with pt's mother. Pt continue to have nausea. Pt is currently taking Dexilant 60 mg qam, Zofran as needed for nausea. Pt went to see her PCP to evaluate nausea and they prescribed her Imipramine. Pt has been taking Imipramine for almost two weeks with no improvement. Pt's mother is concerned because she doesn't know what is causing the nausea. Pt reports no abdominal pain or vomiting.

## 2019-01-31 NOTE — Telephone Encounter (Signed)
Has she made the dietary changes I recommended at the last visit? Avoid spicy, fatty, fried, greasy, and citrus foods. Meats need to be baked, broiled, or grilled. Avoid caffeine, carbonated beverages, and chocolate.  I feel like patient needs to see RMR for evaluation in the next 2-3 weeks. Looks like he has openings available.

## 2019-01-31 NOTE — Telephone Encounter (Signed)
PLEASE CALL PATIENT MOTHER-SHE CALLED STATING THAT HER DAUGHTER IS STILL HAVING NAUSEA AND THEY WOULD LIKE SOME RECOMMENDATIONS

## 2019-02-14 ENCOUNTER — Ambulatory Visit: Payer: Medicaid Other | Admitting: Internal Medicine

## 2019-02-28 ENCOUNTER — Ambulatory Visit: Payer: Medicaid Other | Admitting: Internal Medicine

## 2019-03-04 ENCOUNTER — Encounter: Payer: Self-pay | Admitting: Internal Medicine

## 2019-03-04 ENCOUNTER — Other Ambulatory Visit: Payer: Self-pay

## 2019-03-04 ENCOUNTER — Ambulatory Visit (INDEPENDENT_AMBULATORY_CARE_PROVIDER_SITE_OTHER): Payer: Medicaid Other | Admitting: Internal Medicine

## 2019-03-04 VITALS — BP 117/65 | HR 69 | Temp 96.9°F | Ht 64.0 in | Wt 109.0 lb

## 2019-03-04 DIAGNOSIS — K219 Gastro-esophageal reflux disease without esophagitis: Secondary | ICD-10-CM | POA: Diagnosis not present

## 2019-03-04 DIAGNOSIS — R110 Nausea: Secondary | ICD-10-CM | POA: Diagnosis not present

## 2019-03-04 NOTE — Patient Instructions (Signed)
Continue Dexilant 60 mg daily  Trial of Creon - one tablet with meals and one with snacks - samples provided  Call in 10 days and let us know how you are doing with Creon  As discussed, may need referral to the medical centerr to evaluate further.

## 2019-03-04 NOTE — Progress Notes (Signed)
Primary Care Physician:  [name], MD Primary Gastroenterologist:  Dr. Jena Gauss  Pre-Procedure History & Physical: HPI:  Julie Richmond is a 23 y.o. female here for follow-up of 1 year hx of nausea but no vomiting.  Patient perceives nausea when eating;   does not vomit  -  has not had any abdominal pain.  Mother notes loose stools most of the time.  They are particularly malodorous and notes greasy film on the toilet water on a regular basis.  She has lost 9 pounds since her last visit.  She is able to eat but again has a nauseating sensation.  Extensive work-up thus far:  EGD mild erosive reflux esophagitis, ultrasound abdomen negative, HIDA with CCK negative, gastric emptying study normal, celiac screen serologies negative.  Rare NSAID use.  No improvement with Protonix and more recently Dexilant 60 mg daily.  Denies dysphagia.  Mother notes patient spending lots of time in the bathroom with BMs. Over half a dozen a day. Extremely malodorous. Greasy in appearance. Oilly appearing toilet bowl water at times.  Past Medical History:  Diagnosis Date  . Blind   . Cerebral palsy (HCC)   . Encounter for menstrual regulation 10/08/2015  . Menstrual extraction 09/08/2013  . 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 BIOPSY  01/06/2019   Surgeon: [name], MD; mild reactive gastropathy and intestinal metaplasia present focally, negative for dysplasia and H. pylori.  . ESOPHAGOGASTRODUODENOSCOPY (EGD) WITH PROPOFOL N/A 01/06/2019   PROPOFOL;  Surgeon: [name], MD; nonerosive reflux esophagitis, small hiatal hernia, nodular gastric mucosa s/p biopsy.  Pathology with mild reactive gastropathy and intestinal metaplasia present focally, negative for dysplasia and H. pylori.  . hamstrings lengthened Bilateral     Prior to Admission medications   Medication Sig Start Date End Date Taking? Authorizing Provider   dexlansoprazole (DEXILANT) 60 MG capsule Take 60 mg by mouth daily.   Yes [provider]  ibuprofen (ADVIL,MOTRIN) 200 MG tablet Take 400-600 mg by mouth as needed (headaches.).    Yes [provider]  megestrol (MEGACE) 20 MG tablet Take 20 mg by mouth daily.   Yes [provider]  ondansetron (ZOFRAN) 4 MG tablet Take 1 tablet (4 mg total) by mouth every 8 (eight) hours as needed for nausea or vomiting. 12/09/18  Yes [name], PA-C    Allergies as of 03/04/2019  . (No Known Allergies)    Family History  Problem Relation Age of Onset  . Diabetes Maternal Grandfather   . COPD Paternal Grandmother   . Cancer Paternal Grandmother        intestines. Had colostomy. Passed form cancer at age 70.   . Inflammatory bowel disease Neg Hx   . Celiac disease Neg Hx   . Colon cancer Neg Hx     Social History   Socioeconomic History  . Marital status: Single    Spouse name: Not on file  . Number of children: Not on file  . Years of education: Not on file  . Highest education level: Not on file  Occupational History  . Not on file  Social Needs  . Financial resource strain: Not on file  . Food insecurity    Worry: Not on file    Inability: Not on file  . Transportation needs    Medical: Not on file    Non-medical: Not on file  Tobacco Use  . Smoking status: Passive Smoke Exposure - Never Smoker  . Smokeless tobacco: Never Used  Substance and Sexual Activity  . Alcohol use: No  . Drug use: No  . Sexual activity: Never    Birth control/protection: None  Lifestyle  . Physical activity    Days per week: Not on file    Minutes per session: Not on file  . Stress: Not on file  Relationships  . Social Herbalist on phone: Not on file    Gets together: Not on file    Attends religious service: Not on file    Active member of club or organization: Not on file    Attends meetings of clubs or organizations: Not on file    Relationship  status: Not on file  . Intimate partner violence    Fear of current or ex partner: Not on file    Emotionally abused: Not on file    Physically abused: Not on file    Forced sexual activity: Not on file  Other Topics Concern  . Not on file  Social History Narrative   Lives with parents, younger sister       No pets       Dad smokes       No school or work     Review of Systems: See HPI, otherwise negative ROS  Physical Exam: BP 117/65   Pulse 69   Temp (!) 96.9 F (36.1 C) (Oral)   Ht 5\' 4"  (1.626 m)   Wt 109 lb (49.4 kg)   LMP 02/11/2019   BMI 18.71 kg/m  General:   Alert, wheelchair-bound.  Disconjugate gaze.  Pleasant and conversant. Accompanied by spouse. Lungs:  Clear throughout to auscultation.   No wheezes, crackles, or rhonchi. No acute distress. Heart:  Regular rate and rhythm; no murmurs, clicks, rubs,  or gallops. Abdomen: Non-distended, normal bowel sounds.  Soft and nontender without appreciable mass or hepatosplenomegaly.    Impression/Plan: Pleasant 23 year old lady with CP with symptoms of "nausea" for a year now.  Unintentional weight loss.  Loose stools malodorous stools.  Possible malabsorption based on mother's observations. Extensive work-up thus far; mother notes symptoms started when she came off Zantac   -  more likely a coincidence rather than a causative factor. So, pancreatic exocrine insufficiency is a consideration here although prominent nausea symptoms alone would not be a typical manifestation of this entity.  Recommendations: Continue Dexilant 60 mg daily  Trial of Creon - one tablet with meals and one with snacks - samples provided  Call in 10 days and let us know how you are doing with Creon  As discussed, may need referral to the medical centerr to evaluate further.   Notice: This dictation was prepared with Dragon dictation along with smaller phrase technology. Any transcriptional errors that result from this process are  unintentional and may not be corrected upon review.

## 2019-03-10 ENCOUNTER — Ambulatory Visit: Payer: Medicaid Other | Admitting: Gastroenterology

## 2019-03-10 ENCOUNTER — Telehealth: Payer: Self-pay | Admitting: *Deleted

## 2019-03-10 MED ORDER — MEGESTROL ACETATE 20 MG PO TABS: 20.0000 mg | ORAL_TABLET | Freq: Every day | ORAL | 6 refills | Status: DC

## 2019-03-10 NOTE — Telephone Encounter (Signed)
Will refill megace 

## 2019-03-10 NOTE — Telephone Encounter (Signed)
Patients mother called. Patient needs a new rx of Megestrol sent in. Her GI doctor had her hold it for a couple of months to see if it was causing her nausea. It was determined that it was not the cause. She tried to get it refilled and the pharmacy had cancelled the prescription. They stated that Anderson Malta would have to e prescribe it again. I told mother that I will send message to Anderson Malta and that she can check with pharmacy later today.

## 2019-03-31 ENCOUNTER — Telehealth: Payer: Self-pay | Admitting: Internal Medicine

## 2019-03-31 NOTE — Telephone Encounter (Signed)
Spoke with pts mother. Pt took the Creon as directed for 12-13 days. Pt's mother d/c pts medication, due to it not helping pt. Pt still reports nausea. Pt isn't taking Zofran to help with the nausea. Pt is eating and was placed back on Megestrol 20 mg, which increases pts appetite while on it. The medication was prescribed to stop pts menstruation monthly.

## 2019-03-31 NOTE — Telephone Encounter (Signed)
Pt's mother called to let us know that the Creon was not helping. Please advise [phone]

## 2019-03-31 NOTE — Telephone Encounter (Signed)
Pt's mother is aware that RMR isn't in the office and will return tomorrow. Pt's mother is ok with this.

## 2019-04-01 NOTE — Telephone Encounter (Signed)
Spoke with pt's mother and informed her of RMR's recommendations.  She scheduled her daughter for an ov on 04/18/2019 at 10:30 with RMR.  She wants to know if we can send a RX for a different acid reflux medicine to [facility] before her daughter's appt.  Mother says that she feels like the Dexilant is not working.  Mother also reports that pt becomes pale and sweaty when the nausea is at its worst.

## 2019-04-01 NOTE — Telephone Encounter (Signed)
Pancreatic enzyme therapy was worth trying.  Sounds like you are making better progress with Megestrol; I suggest she continue to observe oral intake on her new medication.  Follow-up here as scheduled.  Please let her know.

## 2019-04-18 ENCOUNTER — Ambulatory Visit (INDEPENDENT_AMBULATORY_CARE_PROVIDER_SITE_OTHER): Payer: Medicaid Other | Admitting: Internal Medicine

## 2019-04-18 ENCOUNTER — Other Ambulatory Visit: Payer: Self-pay

## 2019-04-18 ENCOUNTER — Encounter: Payer: Self-pay | Admitting: Internal Medicine

## 2019-04-18 VITALS — BP 121/77 | HR 108 | Temp 97.5°F | Ht 63.0 in | Wt 109.8 lb

## 2019-04-18 DIAGNOSIS — R110 Nausea: Secondary | ICD-10-CM

## 2019-04-18 DIAGNOSIS — K2100 Gastro-esophageal reflux disease with esophagitis, without bleeding: Secondary | ICD-10-CM

## 2019-04-18 MED ORDER — HYOSCYAMINE SULFATE ER 0.375 MG PO TB12: 0.3750 mg | ORAL_TABLET | Freq: Two times a day (BID) | ORAL | 2 refills | Status: DC

## 2019-04-18 NOTE — Patient Instructions (Signed)
Continue Dexilant 60 mg daily  Continue Megace daily  Use Zofran as needed  Begin Lev-BID - one tablet daily (disp - 30 with 2 refills)  Anti-dumping syndrome diet  OV in 6 weeks

## 2019-04-18 NOTE — Progress Notes (Signed)
Primary Care Physician:  [name], MD Primary Gastroenterologist:  Dr.   Pre-Procedure History & Physical: HPI:  Julie Richmond is a 24 y.o. female history of cerebral palsy/blindness here for for follow-up of GERD and nausea.  70-month history.  Mild reflux esophagitis seen at EGD.  Gallbladder work-up HIDA ultrasound GES all negative/normal.  No improvement with pancreatic supplements empirically.  Reflux component well-controlled on Dexilant.  Continues on Megace every day (drug holiday did not help her nausea).  I reviewed her symptoms at length with her mother in attendance today.  Often becomes pale and gets diaphoretic around the time of her meal intake.  She is able to complete only ingest a full plate of food.  Goes to the bathroom shortly after that.  She has never vomited.  No history of prior GI surgery.  Past Medical History:  Diagnosis Date  . Blind   . Cerebral palsy (HCC)   . Encounter for menstrual regulation 10/08/2015  . Menstrual extraction 09/08/2013  . 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 BIOPSY  01/06/2019   Surgeon: [name], MD; mild reactive gastropathy and intestinal metaplasia present focally, negative for dysplasia and H. pylori.  . ESOPHAGOGASTRODUODENOSCOPY (EGD) WITH PROPOFOL N/A 01/06/2019   PROPOFOL;  Surgeon: [name], MD; nonerosive reflux esophagitis, small hiatal hernia, nodular gastric mucosa s/p biopsy.  Pathology with mild reactive gastropathy and intestinal metaplasia present focally, negative for dysplasia and H. pylori.  . hamstrings lengthened Bilateral     Prior to Admission medications   Medication Sig Start Date End Date Taking? Authorizing Provider  dexlansoprazole (DEXILANT) 60 MG capsule Take 60 mg by mouth daily.   Yes [provider]  ibuprofen (ADVIL,MOTRIN) 200 MG tablet Take 400-600 mg by mouth as needed (headaches.).    Yes [provider]  megestrol (MEGACE) 20 MG tablet Take 1 tablet (20 mg total) by mouth daily. 03/10/19  Yes [name], NP  ondansetron (ZOFRAN) 4 MG tablet Take 1 tablet (4 mg total) by mouth every 8 (eight) hours as needed for nausea or vomiting. Patient not taking: Reported on 04/18/2019 12/09/18   [name], PA-C    Allergies as of 04/18/2019  . (No Known Allergies)    Family History  Problem Relation Age of Onset  . Diabetes Maternal Grandfather   . COPD Paternal Grandmother   . Cancer Paternal Grandmother        intestines. Had colostomy. Passed form cancer at age 33.   . Inflammatory bowel disease Neg Hx   . Celiac disease Neg Hx   . Colon cancer Neg Hx     Social History   Socioeconomic History  . Marital status: Single    Spouse name: Not on file  . Number of children: Not on file  . Years of education: Not on file  . Highest education level: Not on file  Occupational History  . Not on file  Tobacco Use  . Smoking status: Passive Smoke Exposure - Never Smoker  . Smokeless tobacco: Never Used  Substance and Sexual Activity  . Alcohol use: No  . Drug use: No  . Sexual activity: Never    Birth control/protection: None  Other Topics Concern  . Not on file  Social History Narrative   Lives with parents, younger sister       No pets  Dad smokes       No school or work    Scientist, physiological Strain:   . Difficulty of Paying Living Expenses: Not on file  Food Insecurity:   . Worried About Charity fundraiser in the Last Year: Not on file  . Ran Out of Food in the Last Year: Not on file  Transportation Needs:   . Lack of Transportation (Medical): Not on file  . Lack of Transportation (Non-Medical): Not on file  Physical Activity:   . Days of Exercise per Week: Not on file  . Minutes of Exercise per Session: Not on file  Stress:   . Feeling of Stress : Not on file  Social Connections:   . Frequency of  Communication with Friends and Family: Not on file  . Frequency of Social Gatherings with Friends and Family: Not on file  . Attends Religious Services: Not on file  . Active Member of Clubs or Organizations: Not on file  . Attends Archivist Meetings: Not on file  . Marital Status: Not on file  Intimate Partner Violence:   . Fear of Current or Ex-Partner: Not on file  . Emotionally Abused: Not on file  . Physically Abused: Not on file  . Sexually Abused: Not on file    Review of Systems: See HPI, otherwise negative ROS  Physical Exam: BP 121/77   Pulse (!) 108   Temp (!) 97.5 F (36.4 C)   Ht 5\' 3"  (1.6 m)   Wt 109 lb 12.8 oz (49.8 kg)   BMI 19.45 kg/m  General:    pleasant and cooperative in NAD.  Accompanied by her mother. Abdomen: Non-distended, normal bowel sounds.  Soft and nontender without appreciable mass or hepatosplenomegaly.   Impression/Plan: 24 year old lady with cerebral palsy/blindness,  10-month history of nausea.  History of GERD with mild reflux esophagitis seen at prior EGD.  Reflux component of her symptoms well controlled on Dexilant.  She has continued interesting symptoms of intermittent nausea when she eats  -  she never vomits.  Mother reporting pale appearance and diaphoresis around the time of eating leading to a bowel movement.  Irritable bowel syndrome more likely in the mix.  She does not have an anatomical reason for dumping syndrome but symptoms are somewhat reminiscent of that entity.  No alarm symptoms otherwise.  Recommendations:  Continue Dexilant 60 mg daily  Continue Megace daily  Use Zofran as needed  Begin Lev-BID - one tablet daily (disp - 30 with 2 refills)  Anti-dumping syndrome diet  OV in 6 weeks   Notice: This dictation was prepared with Dragon dictation along with smaller phrase technology. Any transcriptional errors that result from this process are unintentional and may not be corrected upon review.

## 2019-04-22 ENCOUNTER — Telehealth: Payer: Self-pay | Admitting: Internal Medicine

## 2019-04-22 NOTE — Telephone Encounter (Signed)
Spoke with pts mother. Medication not approved under insurance is Levbid. Will contact pts insurance to see what medications are covered under insurance.

## 2019-04-22 NOTE — Telephone Encounter (Signed)
[phone]  THE PRESCRIPTION THAT WAS SENT IN FOR PATIENTS DUMPING SYNDROME IS NOT COVERED BY HER INSURANCE, THEY NEED AN ALTERNATIVE

## 2019-04-24 ENCOUNTER — Other Ambulatory Visit: Payer: Self-pay

## 2019-04-24 MED ORDER — RABEPRAZOLE SODIUM 20 MG PO TBEC: 20.0000 mg | DELAYED_RELEASE_TABLET | Freq: Every day | ORAL | 11 refills | Status: DC

## 2019-04-24 NOTE — Telephone Encounter (Signed)
Side effects are always a possibility.  Lets go with AcipHex 20 mg once a day.  Dispense 30 tablets with 11 refills

## 2019-04-24 NOTE — Telephone Encounter (Signed)
Noted. Spoke with pts mother. Aciphex 20 mg daily #30 w 11 rfs has been sent to pts pharmacy to try. D/c Dexilant at this time.

## 2019-04-24 NOTE — Telephone Encounter (Signed)
Spoke with pts insurance company and it's an error of the pharmacy. Levbid is covered under pts insurnace. Pt's mother is aware that the medication is covered. Before I call pts pharmacy back, pts mother would like to ask RMR if there is any way pts PPI can be changed. Pt's mother states that she is having all the side affects that Dexilant mentions. Pt has previously tried Dexilant, Nexium and Pantoprazole.

## 2019-05-22 ENCOUNTER — Telehealth: Payer: Self-pay | Admitting: *Deleted

## 2019-05-22 MED ORDER — MEGESTROL ACETATE 20 MG PO TABS: ORAL_TABLET | ORAL | 6 refills | Status: DC

## 2019-05-22 NOTE — Telephone Encounter (Signed)
Pts mom has been having to give patient 2 megestrols a day because of bleeding. She wanted to see if Victorino Dike can send in new rx to allow for extra pills when needed.

## 2019-05-22 NOTE — Telephone Encounter (Signed)
Refilled megace  

## 2019-05-29 ENCOUNTER — Telehealth: Payer: Self-pay | Admitting: *Deleted

## 2019-05-29 NOTE — Telephone Encounter (Signed)
Left message that could try 2 megace in am and 2 in pm and let me know how it is working

## 2019-05-29 NOTE — Telephone Encounter (Signed)
Pt's mom wants to know if pt can take 2 Megace in the am and 2 in the pm. Please call. Thanks!!

## 2019-06-03 ENCOUNTER — Ambulatory Visit: Payer: Medicaid Other | Admitting: Internal Medicine

## 2019-06-12 ENCOUNTER — Telehealth: Payer: Self-pay | Admitting: *Deleted

## 2019-06-12 NOTE — Telephone Encounter (Signed)
Pt's mom Amy called to give an update on Julie Richmond. She is taking Megace, 2 in the am and 2 in the pm. When the bleeding stops, Amy decreases to 1 pill at night, but the bleeding just come back. Please advise. Thanks!!

## 2019-06-12 NOTE — Telephone Encounter (Signed)
Pt's mom aware to give Megace 2 tabs BID. Pt's mom voiced understanding. JSY

## 2019-06-25 ENCOUNTER — Telehealth: Payer: Self-pay | Admitting: Internal Medicine

## 2019-06-25 NOTE — Telephone Encounter (Signed)
Dr. Court Joy (PMR physician at [facility]).  Call me about this young lady today.  Still symptomatic with nausea but no vomiting.  Similar symptoms as outlined in my January 2021 office note.  Levbid apparently did not help.  We reviewed her fairly extensive work-up she has already had.  At this time, would need to consider uncommon causes of her symptoms such as increased intracranial pressure. At this point, head CT is not unreasonable.  I feel it needs to be done.  Dr. Court Joy is going to pursue at [facility].  Moreover, if head CT is negative, I feel it would be worthwhile for her to have a tertiary opinion with the GI clinic down at [facility] as well.  She is going to facilitate that avenue of evaluation if patient is interested.

## 2019-07-02 ENCOUNTER — Other Ambulatory Visit (HOSPITAL_COMMUNITY): Payer: Self-pay | Admitting: Physical Medicine and Rehabilitation

## 2019-07-02 ENCOUNTER — Other Ambulatory Visit: Payer: Self-pay | Admitting: Physical Medicine and Rehabilitation

## 2019-07-02 DIAGNOSIS — R112 Nausea with vomiting, unspecified: Secondary | ICD-10-CM

## 2019-07-02 DIAGNOSIS — G801 Spastic diplegic cerebral palsy: Secondary | ICD-10-CM

## 2019-07-18 ENCOUNTER — Ambulatory Visit (HOSPITAL_COMMUNITY): Payer: Medicaid Other

## 2019-07-23 ENCOUNTER — Ambulatory Visit (HOSPITAL_COMMUNITY): Payer: Medicaid Other

## 2019-07-25 ENCOUNTER — Encounter (HOSPITAL_COMMUNITY): Payer: Self-pay

## 2019-07-25 ENCOUNTER — Ambulatory Visit (HOSPITAL_COMMUNITY): Payer: Medicaid Other

## 2019-08-05 ENCOUNTER — Telehealth: Payer: Self-pay | Admitting: Adult Health

## 2019-08-05 NOTE — Telephone Encounter (Signed)
Pts mom states that the megestrol (MEGACE) Is not stopping the pts bleeding and that she is also having a lot of nausea and not feeling well and the mom is wanting to know if this could be related to issues with her menstrual cycle. Requesting to speak with the nurse or Victorino Dike.

## 2019-08-05 NOTE — Telephone Encounter (Addendum)
Taking Megace once a day; when she has bleeding, she takes 2 BID if still bleeding in the afternoon. She also continues to have nausea since last July. Pt's mom don't know what to do. Please advise. Thanks!! JSY

## 2019-08-06 NOTE — Telephone Encounter (Signed)
Pt's mom advised Jenn don't think the nausea is 100% related to the Megace. Britt Boozer advises pt see her family doc. Pt's mom states they don't have any answers either so will plan on seeing GI doc in [place]. JSY

## 2019-10-10 ENCOUNTER — Encounter: Payer: Self-pay | Admitting: Adult Health

## 2019-10-10 ENCOUNTER — Ambulatory Visit (INDEPENDENT_AMBULATORY_CARE_PROVIDER_SITE_OTHER): Payer: Medicaid Other | Admitting: Adult Health

## 2019-10-10 VITALS — BP 144/78 | HR 123

## 2019-10-10 DIAGNOSIS — Z7689 Persons encountering health services in other specified circumstances: Secondary | ICD-10-CM | POA: Insufficient documentation

## 2019-10-10 DIAGNOSIS — R110 Nausea: Secondary | ICD-10-CM | POA: Diagnosis not present

## 2019-10-10 DIAGNOSIS — N926 Irregular menstruation, unspecified: Secondary | ICD-10-CM

## 2019-10-10 MED ORDER — MEGESTROL ACETATE 20 MG PO TABS: ORAL_TABLET | ORAL | 6 refills | Status: DC

## 2019-10-10 NOTE — Progress Notes (Signed)
°  Subjective:     Patient ID: Julie Richmond, female   DOB: 01/16/1996, 24 y.o.   MRN: [id number]  HPI Julie Richmond is a 24 year old white female,single, G0P0, has CP, in a whelchair and is blind in with her mom on follow up of irregular bleeding controlled with megace.Dr Dimas Aguas had her stop megace to see if was causing any of her nausea, back in September and she was off the megace for a few months and periods returned and had irregular bleeding.She has since started it back and takes 1-2 daily to control the bleeding.She had EGD and other Gi studies, and now has appt in September in [place] with Gi specialist her mom says, appetite not has good, eats small meals. PCP is Dr Dimas Aguas  Review of Systems +irregular bleeding with periods +nausea Has never had sex.  Reviewed past medical,surgical, social and family history. Reviewed medications and allergies.     Objective:   Physical Exam BP (!) 144/78 (BP Location: Left Arm, Patient Position: Sitting, Cuff Size: Normal)    Pulse (!) 123   Skin warm and dry. Neck: mid line trachea, normal thyroid, good ROM, no lymphadenopathy noted. Lungs: clear to ausculation bilaterally. Cardiovascular: regular rate and rhythm.  Upstream - 10/10/19 0920      Pregnancy Intention Screening   Does the patient want to become pregnant in the next year? No    Does the patient's partner want to become pregnant in the next year? No    Would the patient like to discuss contraceptive options today? No      Contraception Wrap Up   Current Method No Method - Other Reason    End Method No Method - Other Reason    Contraception Counseling Provided No             Assessment:     1. Encounter for menstrual regulation Will refill megace can take 2 daily Meds ordered this encounter  Medications   megestrol (MEGACE) 20 MG tablet    Sig: Take 2 tablets daily to stop bleeding    Dispense:  60 tablet    Refill:  6    Order Specific Question:   Supervising  Provider    Answer:   Duane Lope H [[id number]]    2. Nausea Try eating 2 saltine crackers before getting out of bed and drink pepsi over ice not from can, to see if helps     Plan:     Follow up in 6 months or sooner if needed

## 2019-10-29 ENCOUNTER — Telehealth: Payer: Self-pay | Admitting: Emergency Medicine

## 2019-10-29 NOTE — Telephone Encounter (Signed)
PA for rabprazole soudium 20 mg was denied. It is not on preferred list  Pt must try and fail  esomeprazole magnesium capsule (generic for NexiumRx ) esomeprazole magnesium capsule OTC (generic for Nexium lansoprazole capsule (generic for Prevacid RX NexiumRx Packet omeprazole Rx capsule (generic for PrilosecRx) pantoprazole tablet (generic for Protonix  Protonix Suspension Please advise

## 2019-10-29 NOTE — Telephone Encounter (Signed)
Patient tried and failed Protonix and Nexium previously as documented in the record. My recommendation is to continue AcipHex. Also, patient was supposed to been referred to [facility] gastroenterology by the [facility] rehab doctor.  Did this happen?

## 2019-10-29 NOTE — Telephone Encounter (Signed)
Patient was referred to [facility]. Patient has an appointment for 12/22/19. Left voice mail for patient to call me back to use a good rx card for AcipHex. It is $21 at [facility]

## 2019-10-30 NOTE — Telephone Encounter (Signed)
Communication noted.  Thanks. 

## 2019-10-30 NOTE — Telephone Encounter (Signed)
Patients legal guardian Amy returned phone call, Verified patients name and dob. Notified Amy to use good rx card  for aciphex at [facility]. Stated the medication would be $21. Amy voiced understanding and thanked me for the call

## 2019-12-08 ENCOUNTER — Encounter: Payer: Self-pay | Admitting: Neurology

## 2019-12-08 ENCOUNTER — Ambulatory Visit: Payer: Medicaid Other | Admitting: Neurology

## 2019-12-08 ENCOUNTER — Other Ambulatory Visit: Payer: Self-pay

## 2019-12-08 VITALS — BP 133/73 | HR 120

## 2019-12-08 DIAGNOSIS — H548 Legal blindness, as defined in USA: Secondary | ICD-10-CM | POA: Diagnosis not present

## 2019-12-08 DIAGNOSIS — G801 Spastic diplegic cerebral palsy: Secondary | ICD-10-CM | POA: Diagnosis not present

## 2019-12-08 DIAGNOSIS — R1310 Dysphagia, unspecified: Secondary | ICD-10-CM

## 2019-12-08 DIAGNOSIS — R110 Nausea: Secondary | ICD-10-CM

## 2019-12-08 NOTE — Patient Instructions (Signed)
Provider:  Melvyn Novas, MD  Primary Care Physician:  [name], MD @PCPADD @     Referring Provider: , Md [address] [place],  [place]          Chief Complaint according to patient   Patient presents with:    . New Patient (Initial Visit)           HISTORY OF PRESENT ILLNESS:  Julie Richmond is a 24  year old Caucasian female patient seen here as a referral on 12/08/2019 from Dr 12/10/2019 in [place].  Chief concern according to patient : being nauseated for 2 year - not getting relief from medication.    I have the pleasure of seeing Julie Richmond today, a  Right -handed White or Caucasian female who  has a past medical history of being legally blind, wheelchair bound all secondary to  Cerebral palsy (HCC), Encounter for menstrual regulation (10/08/2015), Menstrual extraction (09/08/2013), and Seizure (HCC). Here for now chronic nausea.  Nausea immediately with food intake, sometimes without eating . Has never vomited.  Has not the patient reports that when she is laying flat she does not have the same degree of nausea she actually feels fine.  It is worse as she gets up or has to strain to sit or transfer so it seems to be related to some intrathoracic pressure how nauseated she gets. No dizziness, no vertigo, no pain, no SOB. She takes megestrol to stop her menstrual periods, irregular.        Social history:  Patient is graduated from 09/10/2013- and lives in a household with parents and sister - not employed , not enrolled.  Pets are not  present. Tobacco use- none.  ETOH use- none , Caffeine intake in form of Coffee( /) Soda( pepsi - 2 a day) Tea ( /) or energy drinks.     Review of Systems: Out of a complete 14 system review, the patient complains of only the following symptoms, and all other reviewed systems are negative.:  Nausea , not dizziness, no pain, primary nystagmus of the blind.   not fully vaccinated - and not planning to.    Social  History   Socioeconomic History  . Marital status: Single    Spouse name: Not on file  . Number of children: Not on file  . Years of education: Not on file  . Highest education level: Not on file  Occupational History  . Not on file  Tobacco Use  . Smoking status: Passive Smoke Exposure - Never Smoker  . Smokeless tobacco: Never Used  Vaping Use  . Vaping Use: Never used  Substance and Sexual Activity  . Alcohol use: No  . Drug use: No  . Sexual activity: Never    Birth control/protection: None  Other Topics Concern  . Not on file  Social History Narrative   Lives with parents, younger sister       No pets       Dad smokes       No school or work    [employer] of International aid/development worker Strain:   . Difficulty of Paying Living Expenses: Not on file  Food Insecurity:   . Worried About Corporate investment banker in the Last Year: Not on file  . Ran Out of Food in the Last Year: Not on file  Transportation Needs:   . Lack of Transportation (Medical): Not on  file  . Lack of Transportation (Non-Medical): Not on file  Physical Activity:   . Days of Exercise per Week: Not on file  . Minutes of Exercise per Session: Not on file  Stress:   . Feeling of Stress : Not on file  Social Connections:   . Frequency of Communication with Friends and Family: Not on file  . Frequency of Social Gatherings with Friends and Family: Not on file  . Attends Religious Services: Not on file  . Active Member of Clubs or Organizations: Not on file  . Attends Banker Meetings: Not on file  . Marital Status: Not on file    Family History  Problem Relation Age of Onset  . Diabetes Maternal Grandfather   . COPD Paternal Grandmother   . Cancer Paternal Grandmother        intestines. Had colostomy. Passed form cancer at age 13.   . Inflammatory bowel disease Neg Hx   . Celiac disease Neg Hx   . Colon cancer Neg Hx     Past Medical History:  Diagnosis Date  . Blind     . Cerebral palsy (HCC)   . Encounter for menstrual regulation 10/08/2015  . Menstrual extraction 09/08/2013  . 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 BIOPSY  01/06/2019   Surgeon: [name], MD; mild reactive gastropathy and intestinal metaplasia present focally, negative for dysplasia and H. pylori.  . ESOPHAGOGASTRODUODENOSCOPY (EGD) WITH PROPOFOL N/A 01/06/2019   PROPOFOL;  Surgeon: [name], MD; nonerosive reflux esophagitis, small hiatal hernia, nodular gastric mucosa s/p biopsy.  Pathology with mild reactive gastropathy and intestinal metaplasia present focally, negative for dysplasia and H. pylori.  . hamstrings lengthened Bilateral      Current Outpatient Medications on File Prior to Visit  Medication Sig Dispense Refill  . ibuprofen (ADVIL,MOTRIN) 200 MG tablet Take 400-600 mg by mouth as needed (headaches.).     Marland Kitchen megestrol (MEGACE) 20 MG tablet Take 2 tablets daily to stop bleeding 60 tablet 6  . metoCLOPramide (REGLAN) 5 MG tablet Take 5 mg by mouth 4 (four) times daily.     No current facility-administered medications on file prior to visit.    EXAM:  MRI HEAD WITHOUT CONTRAST   TECHNIQUE:  Multiplanar, multiecho pulse sequences of the brain and surrounding  structures were obtained without intravenous contrast.   COMPARISON: None.   FINDINGS:  Brain: No diffusion-weighted signal abnormality. No intracranial  hemorrhage. No midline shift, mass lesion or ventriculomegaly. No  extra-axial fluid collection.   Diffuse periventricular white matter volume loss and gliosis with ex  vacuo dilatation and an undulating contour of the ventricles.  Thinning of the corpus callosum body.   Vascular: Major intracranial flow voids are preserved.   Skull and upper cervical spine: Normal marrow signal.   Sinuses/Orbits: Normal orbits. Clear paranasal sinuses. No mastoid  effusion.   Other: None.    IMPRESSION:  1. No acute intracranial process.  2. Sequela of periventricular leukomalacia.    Electronically Signed   By: Stana Bunting M.D.   On: 11/12/2019 10:42  Physical exam:  Today's Vitals   12/08/19 1354  BP: 133/73  Pulse: (!) 120   There is no height or weight on file to calculate BMI.   Wt Readings from Last 3 Encounters:  04/18/19 109 lb 12.8 oz (49.8 kg)  03/04/19 109 lb (49.4 kg)  01/15/19 117  lb 3.2 oz (53.2 kg)     Ht Readings from Last 3 Encounters:  04/18/19 5\' 3"  (1.6 m)  03/04/19 5\' 4"  (1.626 m)  01/15/19 5\' 4"  (1.626 m)      General: The patient is awake, alert and appears not in acute distress.  The patient is well groomed. Head: Normocephalic, atraumatic.  Neck is supple. Mallampati 3  neck circumference: 14 inches .  Nasal airflow patent.  Retrognathia is not  seen.  Dental status: intact  Cardiovascular:  Regular rate and cardiac rhythm by pulse, without distended neck veins. Respiratory: Lungs are clear to auscultation.  Skin:  Without evidence of ankle edema, or rash. Trunk: The patient's posture is erect.   Neurologic exam : The patient is awake and alert, oriented to place and time.   Memory subjective described as intact.  Attention span & concentration ability appears normal.  Speech is fluent,  without  dysarthria, dysphonia or aphasia.  Mood and affect are appropriate.   Cranial nerves: no loss of smell or taste reported  Pupils are equal and briskly reactive to light. Funduscopic exam impossible - large pupils with primary eye movements ;  Extraocular movements in vertical and horizontal planes were intact and without nystagmus. No Diplopia. Visual fields by finger perimetry are intact. Hearing was intact to soft voice and finger rubbing.   Facial sensation intact to fine touch. Facial motor strength is symmetric and tongue and uvula move midline.  Neck ROM : rotation, tilt and flexion extension were normal for age  and shoulder shrug was symmetrical.    Motor exam:  Symmetric bulk, tone and ROM for upper extremities- .   Normal tone without cog wheeling, symmetric grip strength .  Sensory:  only upper exremities -Fine touch, pinprick and vibration were normal.  Proprioception tested in the upper extremities was normal.   Coordination: Rapid alternating movements in the fingers/hands were of normal speed.  The Finger-to-nose maneuver was intact,  without evidence of ataxia, dysmetria or tremor.   Gait and station: Patient in wheelchair.  Deep tendon reflexes: in the upper extremities are symmetric and intact. Lower extremities are poorly developed.  Babinski response was deferred.        After spending a total time of 45 minutes face to face and additional time for physical and neurologic examination, review of laboratory studies,  personal review of imaging studies, reports and results of other testing and review of referral information / records as far as provided in visit, I have established the following assessments:  1)  Nausea at baseline for 2 years , here to rule out f brain pressure or changes have caused this symptom. CT was declined by insurance company and MRI ordered through [facility] - no acute changes.  2) no headaches - not Intra Cranial hypertension.  3) chronic , not worsening nausea- not related to cerebral palsy.  4) Reglan doesn't help - she is not constipated, no slow bowel movements. .    My Plan is to proceed with:  1) I have not found evidence neurological origin for this patent's chronic nausea. She may have anxiety.  I appreciate her Gi work up and imaging studies.   I would like to thank 01/17/19, MD and , Md [address] [place],  Estanislado Pandy [address] for allowing me to meet with  this pleasant patient.   In short, Julie Richmond is presenting with chronic nausea , flushing after meals, especially after meat .  It may be worth testing her for anti bodies.      Electronically signed by: Melvyn Novasarmen Wentworth Edelen, MD 12/08/2019 2:27 PM  [facility] and [facility] Board certified by The [employer] of Sleep Medicine and Diplomate of the [employer] of Sleep Medicine. Board certified In Neurology through the ABPN, Fellow of the [employer] of Neu[employer]. Medical Director of [facility].

## 2020-03-05 ENCOUNTER — Encounter: Payer: Self-pay | Admitting: Adult Health

## 2020-03-16 ENCOUNTER — Ambulatory Visit (INDEPENDENT_AMBULATORY_CARE_PROVIDER_SITE_OTHER): Payer: Medicaid Other | Admitting: Adult Health

## 2020-03-16 ENCOUNTER — Encounter: Payer: Self-pay | Admitting: Adult Health

## 2020-03-16 ENCOUNTER — Other Ambulatory Visit: Payer: Self-pay

## 2020-03-16 VITALS — BP 133/70 | HR 115 | Ht 64.0 in | Wt 114.8 lb

## 2020-03-16 DIAGNOSIS — N926 Irregular menstruation, unspecified: Secondary | ICD-10-CM | POA: Insufficient documentation

## 2020-03-16 DIAGNOSIS — Z7689 Persons encountering health services in other specified circumstances: Secondary | ICD-10-CM

## 2020-03-16 DIAGNOSIS — R110 Nausea: Secondary | ICD-10-CM | POA: Diagnosis not present

## 2020-03-16 MED ORDER — NORETHINDRONE ACETATE 5 MG PO TABS: 5.0000 mg | ORAL_TABLET | Freq: Every day | ORAL | 3 refills | Status: DC

## 2020-03-16 MED ORDER — SCOPOLAMINE 1 MG/3DAYS TD PT72: 1.0000 | MEDICATED_PATCH | TRANSDERMAL | 2 refills | Status: DC

## 2020-03-16 NOTE — Progress Notes (Signed)
  Subjective:     Patient ID: Julie Richmond, female   DOB: 08-10-1995, 24 y.o.   MRN: [id number]  HPI Alizeh is a 24 year old white female,single, G0P0, with CP and blind, using walker today in complaining of still bleeding with megace, may be 15/30 days and still has nausea.her mom is with her.  PCP is Dr Dimas Aguas.   Review of Systems +irregular bleeding on megace +nausea, had negative GI work up, thinks it is anxiety related Reviewed past medical,surgical, social and family history. Reviewed medications and allergies.     Objective:   Physical Exam BP 133/70 (BP Location: Right Arm, Patient Position: Sitting, Cuff Size: Normal)   Pulse (!) 115   Ht 5\' 4"  (1.626 m)   Wt 114 lb 12.8 oz (52.1 kg)   LMP 03/15/2020 (Exact Date)   BMI 19.71 kg/m  Skin warm and dry. Lungs: clear to ausculation bilaterally. Cardiovascular: regular rate and rhythm.     Upstream - 03/16/20 1221      Pregnancy Intention Screening   Does the patient want to become pregnant in the next year? No    Does the patient's partner want to become pregnant in the next year? No    Would the patient like to discuss contraceptive options today? No      Contraception Wrap Up   Current Method Abstinence    End Method Abstinence    Contraception Counseling Provided No          Assessment:     1. Encounter for menstrual regulation Stop megace Will try aygestin 5 mg daily Meds ordered this encounter  Medications  . norethindrone (AYGESTIN) 5 MG tablet    Sig: Take 1 tablet (5 mg total) by mouth daily.    Dispense:  30 tablet    Refill:  3    Order Specific Question:   Supervising Provider    Answer:   03/18/20, LUTHER H [[id number]]  . scopolamine (TRANSDERM-SCOP, 1.5 MG,) 1 MG/3DAYS    Sig: Place 1 patch (1.5 mg total) onto the skin every 3 (three) days.    Dispense:  4 patch    Refill:  2    Order Specific Question:   Supervising Provider    Answer:   Despina Hidden, LUTHER H [[id number]]   2. Nausea Will try transderm scop  patch to see if helps, at least during the holidays   3. Irregular bleeding Will get GYN Despina Hidden to assess uterus and ovaries, 04/05/20 at 11:45 am     Plan:     Follow up with me in 6 weeks

## 2020-03-17 ENCOUNTER — Telehealth: Payer: Self-pay | Admitting: Adult Health

## 2020-03-17 NOTE — Telephone Encounter (Signed)
I called [facility] in [place]. Brand name is covered on the Scopolamine patch. Pt's mom aware and can pick up in about an hour. JSY

## 2020-03-17 NOTE — Telephone Encounter (Signed)
Patient's mom called stating that her daughter came to see Victorino Dike yesterday and she was prescribed something for nausea and her pharmacy states that she needs some kind of prior authorization for her insurance to pay. Please call patient's mom when done.

## 2020-04-05 ENCOUNTER — Telehealth: Payer: Self-pay | Admitting: Adult Health

## 2020-04-05 ENCOUNTER — Other Ambulatory Visit: Payer: Self-pay

## 2020-04-05 ENCOUNTER — Ambulatory Visit (INDEPENDENT_AMBULATORY_CARE_PROVIDER_SITE_OTHER): Payer: Medicaid Other

## 2020-04-05 DIAGNOSIS — N926 Irregular menstruation, unspecified: Secondary | ICD-10-CM

## 2020-04-05 NOTE — Telephone Encounter (Signed)
Amy.mom is aware that [place] was normal and she says the bleeding is much better and that the transderm scop patch helped with the nausea

## 2020-04-05 NOTE — Progress Notes (Addendum)
PELVIC US TA only (per Victorino Dike): homogeneous axial positioned uterus,wnl,EEC 2.9 mm,small amount of simple fluid within the endometrium,normal ovaries,no free fluid

## 2020-04-09 ENCOUNTER — Ambulatory Visit: Payer: Medicaid Other | Admitting: Adult Health

## 2020-04-16 ENCOUNTER — Other Ambulatory Visit: Payer: Self-pay | Admitting: Adult Health

## 2020-04-27 ENCOUNTER — Ambulatory Visit: Payer: Medicaid Other | Admitting: Adult Health

## 2020-07-08 ENCOUNTER — Other Ambulatory Visit: Payer: Self-pay | Admitting: Adult Health

## 2020-09-29 ENCOUNTER — Other Ambulatory Visit: Payer: Self-pay | Admitting: Adult Health

## 2021-01-02 ENCOUNTER — Other Ambulatory Visit: Payer: Self-pay | Admitting: Adult Health

## 2021-04-07 ENCOUNTER — Other Ambulatory Visit: Payer: Self-pay | Admitting: Adult Health

## 2021-05-02 IMAGING — US US ABDOMEN LIMITED
1 series · 14 of 25 positions shown · non-contrast
Comparison: None.

CLINICAL DATA: Postprandial nausea

EXAM:
ULTRASOUND ABDOMEN LIMITED RIGHT UPPER QUADRANT

[Series 1: us abdomen limited · 14 of 72 slices shown]
[im 1/72]
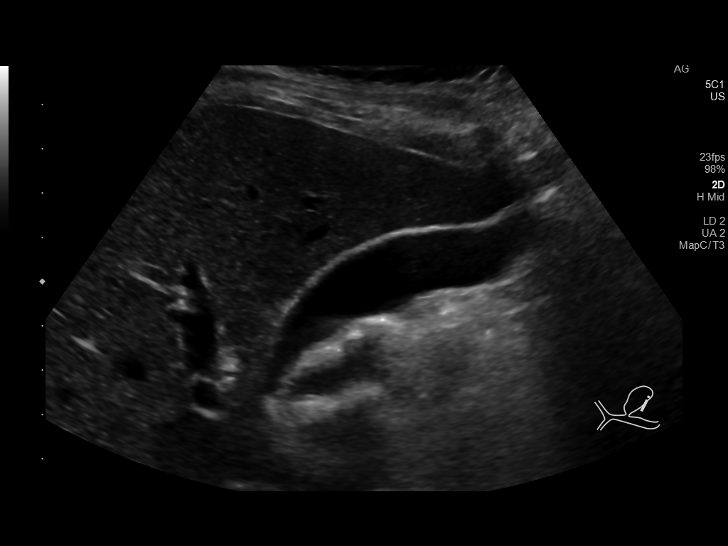
[im 6/72]
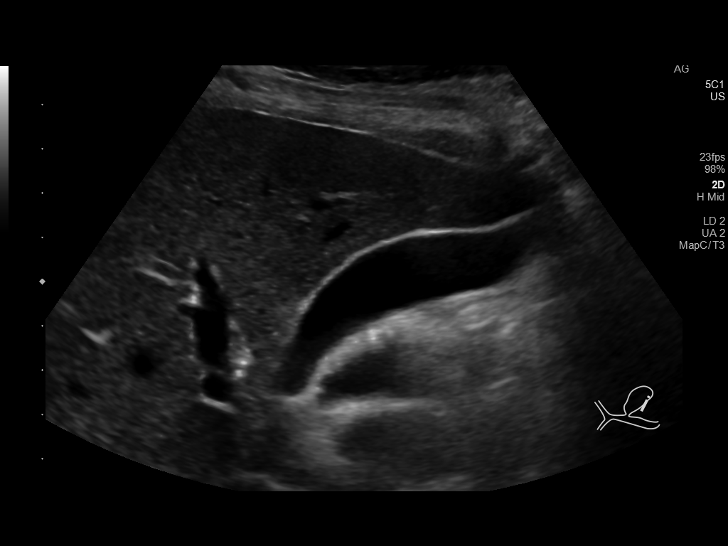
[im 12/72]
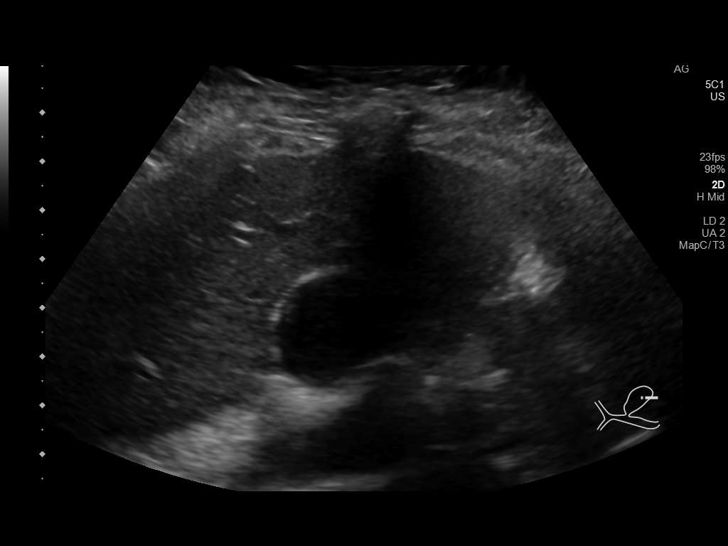
[im 18/72]
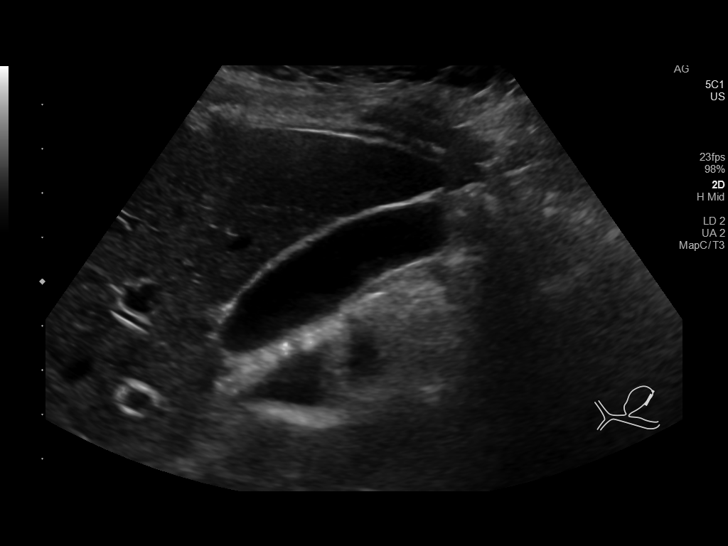
[im 24/72]
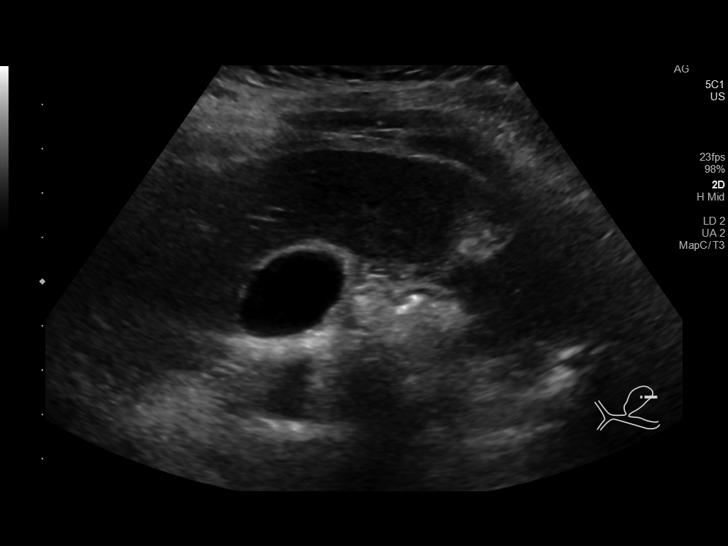
[im 27/72]
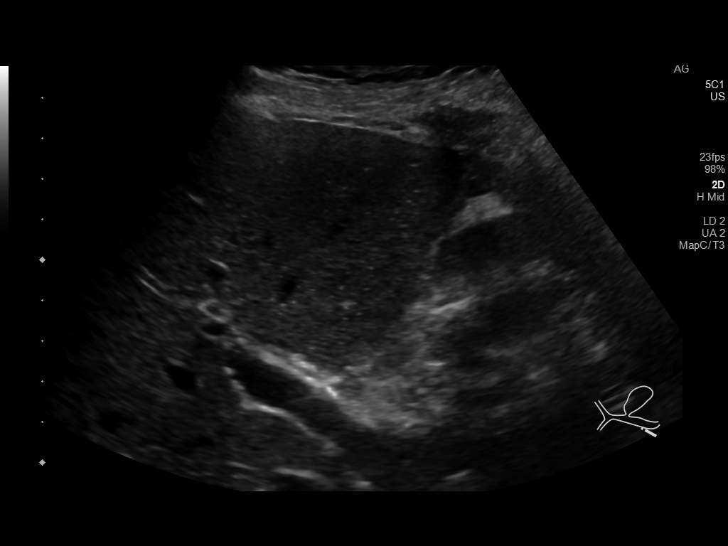
[im 33/72]
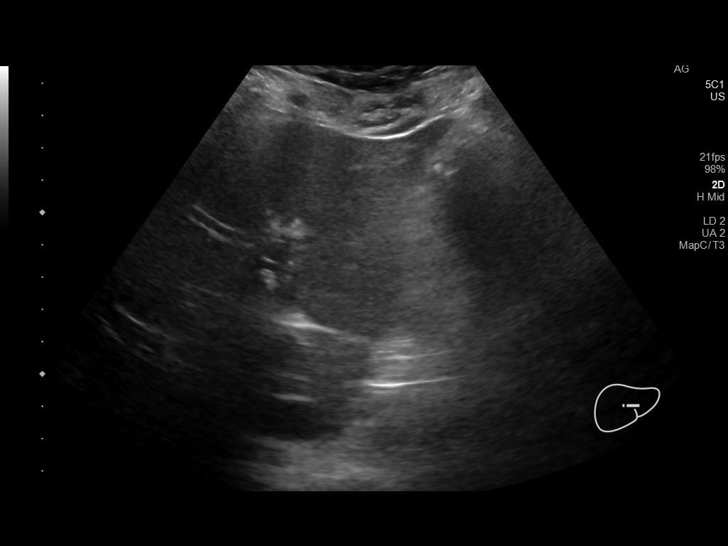
[im 39/72]
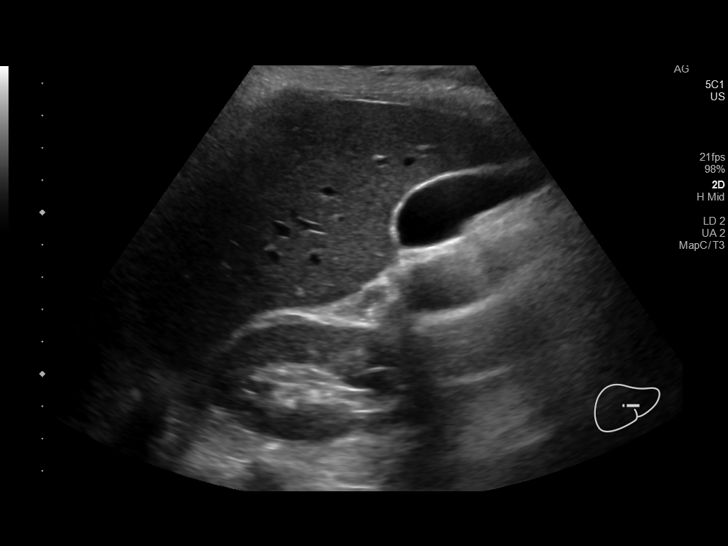
[im 45/72]
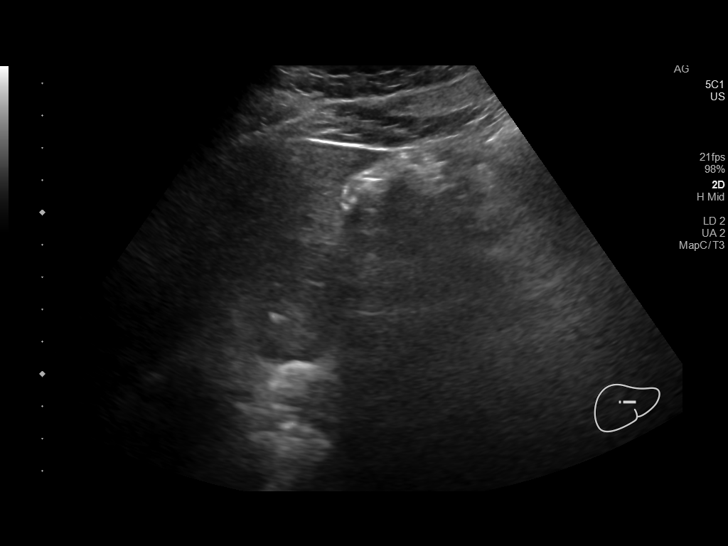
[im 48/72]
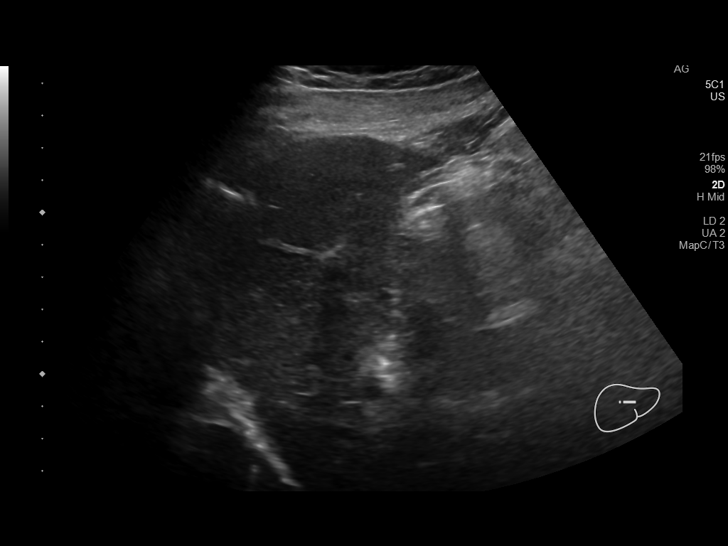
[im 54/72]
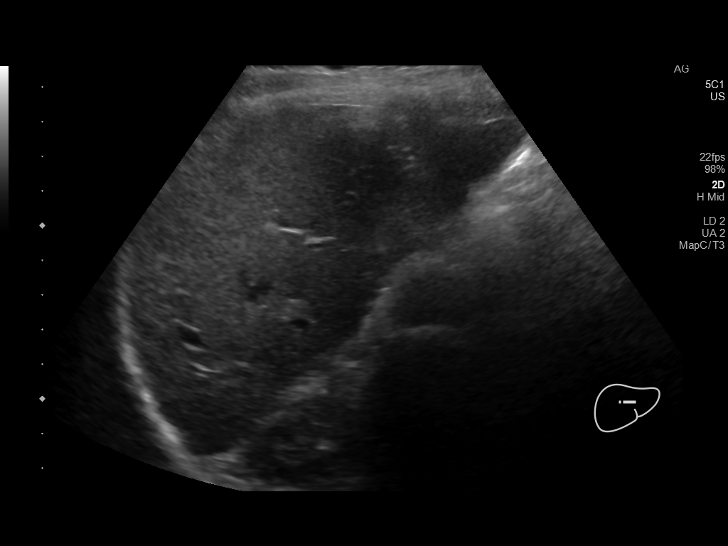
[im 60/72]
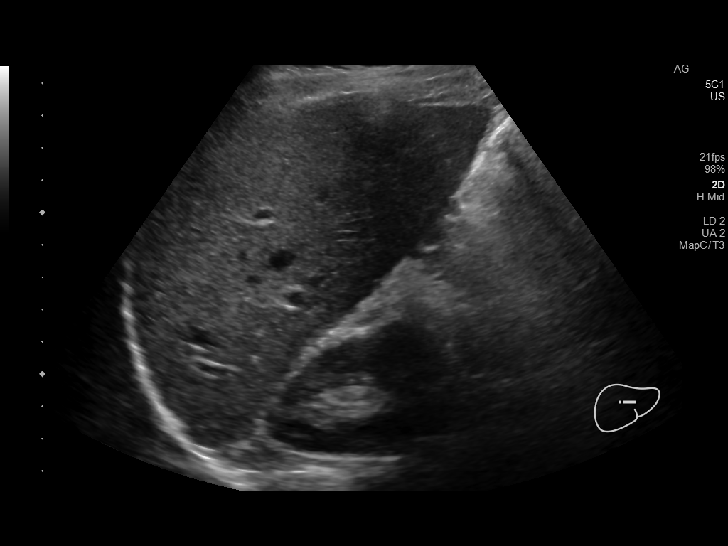
[im 66/72]
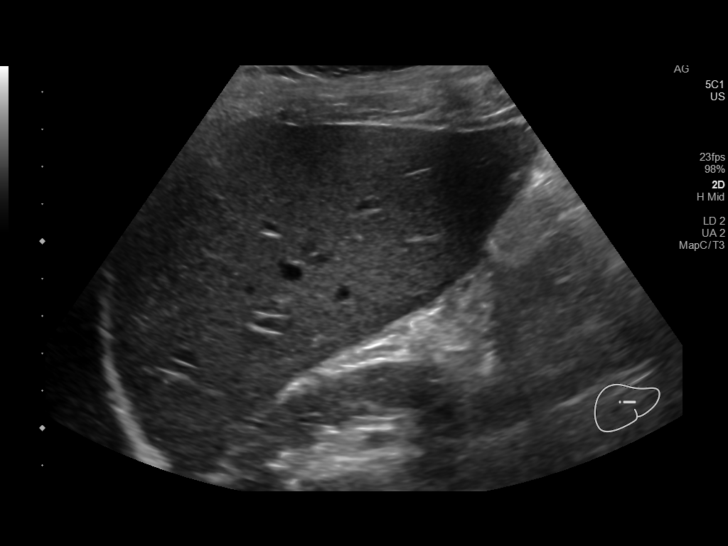
[im 72/72]
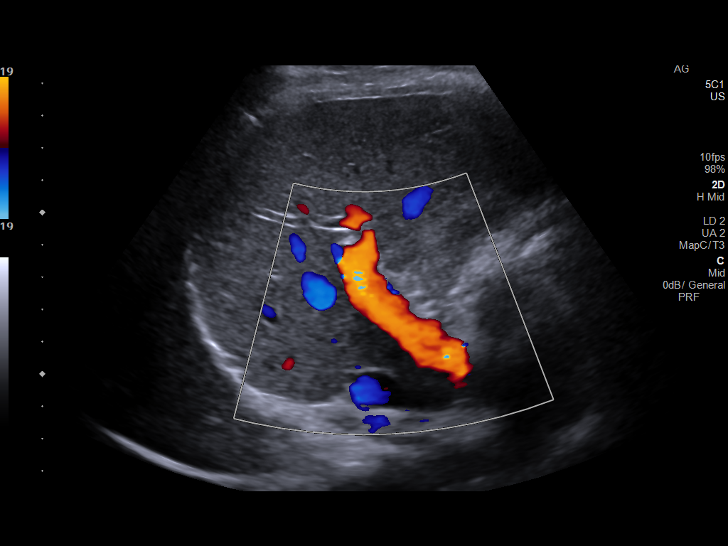

[14 of 25 positions shown; findings below may reference images not displayed]

FINDINGS: Gallbladder:

No gallstones or wall thickening visualized. No sonographic Murphy
sign noted by sonographer.

Common bile duct:

Diameter: 1.4 mm

Liver:

No focal lesion identified. Within normal limits in parenchymal
echogenicity. Portal vein is patent on color Doppler imaging with
normal direction of blood flow towards the liver.

Other: None.
IMPRESSION: Normal right upper quadrant ultrasound.

## 2021-05-04 IMAGING — NM NM HEPATO W/GB/PHARM/[PERSON_NAME]
2 series · 12 of 12 positions shown · non-contrast
Comparison: Ultrasound 12/10/2018, UGI 11/08/2018

CLINICAL DATA: Nausea with eating

EXAM:
NUCLEAR MEDICINE HEPATOBILIARY IMAGING WITH GALLBLADDER EF
TECHNIQUE: Sequential images of the abdomen were obtained [DATE] minutes
following intravenous administration of radiopharmaceutical. After
oral ingestion of Ensure, gallbladder ejection fraction was
determined. At 60 min, normal ejection fraction is greater than 33%.
RADIOPHARMACEUTICALS:  5.1 mCi 9c-55m  Choletec IV

[Series 1: biliary · 3.25mm/px · 6 of 60 frames shown]
[frame 6/60]
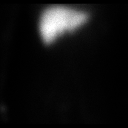
[frame 16/60]
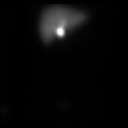
[frame 26/60]
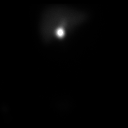
[frame 36/60]
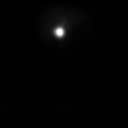
[frame 46/60]
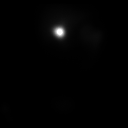
[frame 56/60]
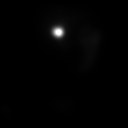

[Series 2: gbef · 3.25mm/px · 6 of 60 frames shown]
[frame 6/60]
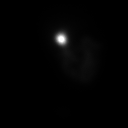
[frame 16/60]
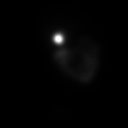
[frame 26/60]
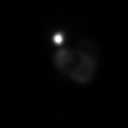
[frame 36/60]
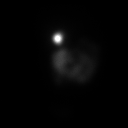
[frame 46/60]
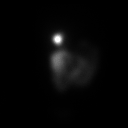
[frame 56/60]
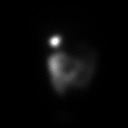

[12 of 12 positions shown; findings below may reference images not displayed]

FINDINGS: Prompt uptake and biliary excretion of activity by the liver is
seen. Gallbladder activity is visualized, consistent with patency of
cystic duct. Biliary activity passes into small bowel, consistent
with patent common bile duct.

Calculated gallbladder ejection fraction is 70%. (Normal gallbladder
ejection fraction with Ensure is greater than 33%.)
IMPRESSION: Negative examination

## 2021-07-05 ENCOUNTER — Other Ambulatory Visit: Payer: Self-pay | Admitting: Adult Health

## 2021-10-08 ENCOUNTER — Other Ambulatory Visit: Payer: Self-pay | Admitting: Adult Health

## 2022-01-26 ENCOUNTER — Other Ambulatory Visit: Payer: Self-pay | Admitting: Adult Health

## 2022-04-30 ENCOUNTER — Other Ambulatory Visit: Payer: Self-pay | Admitting: Adult Health

## 2022-09-01 ENCOUNTER — Other Ambulatory Visit: Payer: Self-pay | Admitting: Adult Health

## 2022-11-29 ENCOUNTER — Other Ambulatory Visit: Payer: Self-pay | Admitting: Adult Health

## 2023-02-27 ENCOUNTER — Other Ambulatory Visit: Payer: Self-pay | Admitting: Adult Health

## 2023-06-02 ENCOUNTER — Other Ambulatory Visit: Payer: Self-pay | Admitting: Adult Health

## 2023-07-03 ENCOUNTER — Other Ambulatory Visit: Payer: Self-pay | Admitting: Adult Health

## 2023-08-02 ENCOUNTER — Telehealth: Payer: Self-pay

## 2023-08-02 ENCOUNTER — Other Ambulatory Visit: Payer: Self-pay | Admitting: Adult Health

## 2023-08-02 NOTE — Telephone Encounter (Signed)
 Called number on file and left voicemail to call to get appointment scheduled within the next 3-4 weeks.

## 2023-08-23 ENCOUNTER — Ambulatory Visit (INDEPENDENT_AMBULATORY_CARE_PROVIDER_SITE_OTHER): Admitting: Adult Health

## 2023-08-23 ENCOUNTER — Encounter: Payer: Self-pay | Admitting: Adult Health

## 2023-08-23 VITALS — BP 113/73 | HR 86 | Ht 64.0 in

## 2023-08-23 DIAGNOSIS — G809 Cerebral palsy, unspecified: Secondary | ICD-10-CM | POA: Diagnosis not present

## 2023-08-23 DIAGNOSIS — Z7689 Persons encountering health services in other specified circumstances: Secondary | ICD-10-CM

## 2023-08-23 DIAGNOSIS — Z1331 Encounter for screening for depression: Secondary | ICD-10-CM | POA: Diagnosis not present

## 2023-08-23 MED ORDER — NORETHINDRONE ACETATE 5 MG PO TABS: 5.0000 mg | ORAL_TABLET | Freq: Every day | ORAL | 12 refills | Status: AC

## 2023-08-23 NOTE — Progress Notes (Signed)
  Subjective:     Patient ID: Julie Richmond, female   DOB: February 11, 1996, 28 y.o.   MRN: [id number]  HPI Julie Richmond is a 28 year old white female,single, G0P0,with CP, in for follow up on taking Aygestin  5 mg 1 daily to stop periods and has not bleeding now.  PCP is [facility]  Review of Systems No bleeding with aygestin  No pain, or nausea Has never had sex Reviewed past medical,surgical, social and family history. Reviewed medications and allergies.     Objective:   Physical Exam BP 113/73 (BP Location: Left Arm, Patient Position: Sitting, Cuff Size: Normal)   Pulse 86   Ht 5\' 4"  (1.626 m)   BMI 19.71 kg/m     Skin warm and dry. Lungs: clear to ausculation bilaterally. Cardiovascular: regular rate and rhythm.  AA is 0 Fall risk is low    08/23/2023    3:55 PM 11/06/2016   11:12 AM 10/09/2016   10:39 AM  Depression screen PHQ 2/9  Decreased Interest 0 0 0  Down, Depressed, Hopeless 0 0 0  PHQ - 2 Score 0 0 0  Altered sleeping 0    Tired, decreased energy 0    Change in appetite 0    Feeling bad or failure about yourself  0    Trouble concentrating 0    Moving slowly or fidgety/restless 0    Suicidal thoughts 0    PHQ-9 Score 0         08/23/2023    3:56 PM  GAD 7 : Generalized Anxiety Score  Nervous, Anxious, on Edge 1  Control/stop worrying 0  Worry too much - different things 1  Trouble relaxing 0  Restless 0  Easily annoyed or irritable 1  Afraid - awful might happen 0  Total GAD 7 Score 3      Upstream - 08/23/23 1554       Pregnancy Intention Screening   Does the patient want to become pregnant in the next year? No    Does the patient's partner want to become pregnant in the next year? No    Would the patient like to discuss contraceptive options today? No      Contraception Wrap Up   Current Method Abstinence;Oral Contraceptive    End Method Abstinence;Oral Contraceptive    Contraception Counseling Provided No             Assessment:     1.  Encounter for menstrual regulation (Primary) No periods with aygestin , will refill Meds ordered this encounter  Medications   norethindrone  (AYGESTIN ) 5 MG tablet    Sig: Take 1 tablet (5 mg total) by mouth daily.    Dispense:  30 tablet    Refill:  12    Supervising Provider:   Evalyn Hillier H [[id number]]     2. Cerebral palsy, unspecified type (HCC)     Plan:     Follow up in 1 year
